# Patient Record
Sex: Male | Born: 1955 | Race: White | Hispanic: No | Marital: Single | Smoking: Former smoker
Health system: Southern US, Community
[De-identification: ages and names within clinical notes are randomized; demographics above are authoritative.]

## PROBLEM LIST (undated history)

## (undated) DIAGNOSIS — F1910 Other psychoactive substance abuse, uncomplicated: Secondary | ICD-10-CM

## (undated) DIAGNOSIS — I739 Peripheral vascular disease, unspecified: Secondary | ICD-10-CM

## (undated) DIAGNOSIS — I2510 Atherosclerotic heart disease of native coronary artery without angina pectoris: Secondary | ICD-10-CM

## (undated) DIAGNOSIS — N289 Disorder of kidney and ureter, unspecified: Secondary | ICD-10-CM

## (undated) DIAGNOSIS — N182 Chronic kidney disease, stage 2 (mild): Secondary | ICD-10-CM

## (undated) DIAGNOSIS — I639 Cerebral infarction, unspecified: Secondary | ICD-10-CM

## (undated) DIAGNOSIS — E785 Hyperlipidemia, unspecified: Secondary | ICD-10-CM

## (undated) DIAGNOSIS — I255 Ischemic cardiomyopathy: Secondary | ICD-10-CM

## (undated) DIAGNOSIS — I10 Essential (primary) hypertension: Secondary | ICD-10-CM

## (undated) HISTORY — PX: CORONARY ARTERY BYPASS GRAFT: SHX141

## (undated) HISTORY — PX: FASCIOTOMY: SHX132

## (undated) HISTORY — PX: TOE AMPUTATION: SHX809

---

## 2012-05-04 ENCOUNTER — Emergency Department: Payer: Self-pay | Admitting: Internal Medicine

## 2012-07-10 ENCOUNTER — Ambulatory Visit: Payer: Self-pay | Admitting: Specialist

## 2012-07-13 ENCOUNTER — Ambulatory Visit: Payer: Self-pay | Admitting: Specialist

## 2012-09-26 ENCOUNTER — Emergency Department: Payer: Self-pay | Admitting: Internal Medicine

## 2012-09-26 LAB — BASIC METABOLIC PANEL
Anion Gap: 8 (ref 7–16)
BUN: 16 mg/dL (ref 7–18)
Calcium, Total: 9 mg/dL (ref 8.5–10.1)
Chloride: 106 mmol/L (ref 98–107)
Co2: 24 mmol/L (ref 21–32)
EGFR (African American): >60
EGFR (Non-African Amer.): >60
Glucose: 124 mg/dL — ABNORMAL HIGH (ref 65–99)
Sodium: 138 mmol/L (ref 136–145)

## 2012-09-26 LAB — TROPONIN I
Troponin-I: <0.02 ng/mL
Troponin-I: <0.02 ng/mL

## 2012-09-26 LAB — CK TOTAL AND CKMB (NOT AT ARMC)
CK, Total: 76 U/L (ref 35–232)
CK-MB: 1 ng/mL (ref 0.5–3.6)

## 2012-09-26 LAB — CBC
HCT: 44.4 % (ref 40.0–52.0)
HGB: 15.4 g/dL (ref 13.0–18.0)
MCH: 35.5 pg — ABNORMAL HIGH (ref 26.0–34.0)
MCHC: 34.8 g/dL (ref 32.0–36.0)
MCV: 102 fL — ABNORMAL HIGH (ref 80–100)
RBC: 4.35 10*6/uL — ABNORMAL LOW (ref 4.40–5.90)
RDW: 13.9 % (ref 11.5–14.5)

## 2013-03-06 ENCOUNTER — Emergency Department: Payer: Self-pay | Admitting: Emergency Medicine

## 2013-03-06 LAB — URINALYSIS, COMPLETE
Bacteria: NONE SEEN
Bilirubin,UR: NEGATIVE
Blood: NEGATIVE
Leukocyte Esterase: NEGATIVE
Specific Gravity: 1.012 (ref 1.003–1.030)
Squamous Epithelial: NONE SEEN

## 2013-03-06 LAB — CBC WITH DIFFERENTIAL/PLATELET
Basophil %: 1 %
Eosinophil %: 3.8 %
HCT: 44.6 % (ref 40.0–52.0)
HGB: 15.6 g/dL (ref 13.0–18.0)
Lymphocyte #: 1.2 10*3/uL (ref 1.0–3.6)
Lymphocyte %: 14.8 %
MCHC: 34.9 g/dL (ref 32.0–36.0)
Monocyte #: 0.7 x10 3/mm (ref 0.2–1.0)
Monocyte %: 8.6 %
Neutrophil #: 5.9 10*3/uL (ref 1.4–6.5)
Neutrophil %: 71.8 %
Platelet: 165 10*3/uL (ref 150–440)
RBC: 4.43 10*6/uL (ref 4.40–5.90)
WBC: 8.2 10*3/uL (ref 3.8–10.6)

## 2013-03-06 LAB — CK TOTAL AND CKMB (NOT AT ARMC)
CK, Total: 123 U/L (ref 35–232)
CK-MB: 0.8 ng/mL (ref 0.5–3.6)

## 2013-03-06 LAB — TROPONIN I
Troponin-I: <0.02 ng/mL
Troponin-I: <0.02 ng/mL

## 2013-03-06 LAB — BASIC METABOLIC PANEL
BUN: 14 mg/dL (ref 7–18)
Calcium, Total: 9.1 mg/dL (ref 8.5–10.1)
Chloride: 104 mmol/L (ref 98–107)
Co2: 26 mmol/L (ref 21–32)
Creatinine: 1.13 mg/dL (ref 0.60–1.30)
Glucose: 127 mg/dL — ABNORMAL HIGH (ref 65–99)
Osmolality: 276 (ref 275–301)
Potassium: 4.4 mmol/L (ref 3.5–5.1)

## 2013-03-21 ENCOUNTER — Emergency Department: Payer: Self-pay

## 2013-03-21 LAB — CBC
MCH: 35.5 pg — ABNORMAL HIGH (ref 26.0–34.0)
MCHC: 35.3 g/dL (ref 32.0–36.0)
MCV: 101 fL — ABNORMAL HIGH (ref 80–100)
Platelet: 169 10*3/uL (ref 150–440)
RDW: 12.9 % (ref 11.5–14.5)
WBC: 9.5 10*3/uL (ref 3.8–10.6)

## 2013-03-21 LAB — BASIC METABOLIC PANEL
Anion Gap: 8 (ref 7–16)
BUN: 14 mg/dL (ref 7–18)
Calcium, Total: 8.6 mg/dL (ref 8.5–10.1)
Creatinine: 1.18 mg/dL (ref 0.60–1.30)
Glucose: 108 mg/dL — ABNORMAL HIGH (ref 65–99)
Osmolality: 279 (ref 275–301)

## 2013-03-21 LAB — APTT: Activated PTT: 33.6 secs (ref 23.6–35.9)

## 2013-05-09 ENCOUNTER — Inpatient Hospital Stay: Payer: Self-pay | Admitting: Specialist

## 2013-05-09 DIAGNOSIS — R7989 Other specified abnormal findings of blood chemistry: Secondary | ICD-10-CM

## 2013-05-09 DIAGNOSIS — I10 Essential (primary) hypertension: Secondary | ICD-10-CM

## 2013-05-09 DIAGNOSIS — I2510 Atherosclerotic heart disease of native coronary artery without angina pectoris: Secondary | ICD-10-CM

## 2013-05-09 DIAGNOSIS — I209 Angina pectoris, unspecified: Secondary | ICD-10-CM

## 2013-05-09 LAB — CK TOTAL AND CKMB (NOT AT ARMC)
CK, TOTAL: 238 U/L — AB (ref 35–232)
CK, Total: 179 U/L (ref 35–232)
CK, Total: 176 U/L (ref 35–232)
CK-MB: 1.5 ng/mL (ref 0.5–3.6)
CK-MB: 1.5 ng/mL (ref 0.5–3.6)
CK-MB: 1.7 ng/mL (ref 0.5–3.6)

## 2013-05-09 LAB — LIPASE, BLOOD: LIPASE: 343 U/L (ref 73–393)

## 2013-05-09 LAB — COMPREHENSIVE METABOLIC PANEL
ANION GAP: 12 (ref 7–16)
AST: 21 U/L (ref 15–37)
Albumin: 3.7 g/dL (ref 3.4–5.0)
Alkaline Phosphatase: 59 U/L
BUN: 20 mg/dL — AB (ref 7–18)
Bilirubin,Total: 0.3 mg/dL (ref 0.2–1.0)
CALCIUM: 9.1 mg/dL (ref 8.5–10.1)
CHLORIDE: 106 mmol/L (ref 98–107)
CO2: 24 mmol/L (ref 21–32)
CREATININE: 1.42 mg/dL — AB (ref 0.60–1.30)
EGFR (Non-African Amer.): 54 — ABNORMAL LOW
Glucose: 112 mg/dL — ABNORMAL HIGH (ref 65–99)
OSMOLALITY: 286 (ref 275–301)
Potassium: 3.7 mmol/L (ref 3.5–5.1)
SGPT (ALT): 34 U/L (ref 12–78)
SODIUM: 142 mmol/L (ref 136–145)
Total Protein: 7.3 g/dL (ref 6.4–8.2)

## 2013-05-09 LAB — TROPONIN I
TROPONIN-I: 0.12 ng/mL — AB
Troponin-I: 0.13 ng/mL — ABNORMAL HIGH
Troponin-I: 0.11 ng/mL — ABNORMAL HIGH

## 2013-05-09 LAB — CBC
HCT: 44.8 % (ref 40.0–52.0)
HGB: 15 g/dL (ref 13.0–18.0)
MCH: 34.7 pg — AB (ref 26.0–34.0)
MCHC: 33.6 g/dL (ref 32.0–36.0)
MCV: 103 fL — ABNORMAL HIGH (ref 80–100)
Platelet: 163 10*3/uL (ref 150–440)
RBC: 4.33 10*6/uL — ABNORMAL LOW (ref 4.40–5.90)
RDW: 14.6 % — ABNORMAL HIGH (ref 11.5–14.5)
WBC: 6.7 10*3/uL (ref 3.8–10.6)

## 2013-05-09 LAB — ETHANOL
ETHANOL %: 0.034 % (ref 0.000–0.080)
Ethanol: 34 mg/dL

## 2013-05-10 LAB — BASIC METABOLIC PANEL
Anion Gap: 7 (ref 7–16)
BUN: 13 mg/dL (ref 7–18)
CHLORIDE: 110 mmol/L — AB (ref 98–107)
CREATININE: 0.94 mg/dL (ref 0.60–1.30)
Calcium, Total: 8.2 mg/dL — ABNORMAL LOW (ref 8.5–10.1)
Co2: 20 mmol/L — ABNORMAL LOW (ref 21–32)
EGFR (Non-African Amer.): >60
Glucose: 121 mg/dL — ABNORMAL HIGH (ref 65–99)
OSMOLALITY: 275 (ref 275–301)
Potassium: 5.2 mmol/L — ABNORMAL HIGH (ref 3.5–5.1)
Sodium: 137 mmol/L (ref 136–145)

## 2013-05-10 LAB — CBC WITH DIFFERENTIAL/PLATELET
BASOS ABS: 0.1 10*3/uL (ref 0.0–0.1)
Basophil %: 0.9 %
Eosinophil #: 0.3 10*3/uL (ref 0.0–0.7)
Eosinophil %: 4.8 %
HCT: 42.5 % (ref 40.0–52.0)
HGB: 14.8 g/dL (ref 13.0–18.0)
LYMPHS ABS: 1.1 10*3/uL (ref 1.0–3.6)
LYMPHS PCT: 16 %
MCH: 36 pg — AB (ref 26.0–34.0)
MCHC: 34.9 g/dL (ref 32.0–36.0)
MCV: 103 fL — ABNORMAL HIGH (ref 80–100)
MONOS PCT: 10.9 %
Monocyte #: 0.8 x10 3/mm (ref 0.2–1.0)
NEUTROS ABS: 4.8 10*3/uL (ref 1.4–6.5)
Neutrophil %: 67.4 %
PLATELETS: 135 10*3/uL — AB (ref 150–440)
RBC: 4.12 10*6/uL — AB (ref 4.40–5.90)
RDW: 14.3 % (ref 11.5–14.5)
WBC: 7.1 10*3/uL (ref 3.8–10.6)

## 2013-05-10 LAB — LIPID PANEL
CHOLESTEROL: 256 mg/dL — AB (ref 0–200)
HDL: 19 mg/dL — AB (ref 40–60)
Triglycerides: 1125 mg/dL — ABNORMAL HIGH (ref 0–200)

## 2013-05-10 LAB — MAGNESIUM: Magnesium: 2.5 mg/dL — ABNORMAL HIGH

## 2013-07-03 ENCOUNTER — Inpatient Hospital Stay: Payer: Self-pay | Admitting: Internal Medicine

## 2013-07-03 DIAGNOSIS — I2510 Atherosclerotic heart disease of native coronary artery without angina pectoris: Secondary | ICD-10-CM

## 2013-07-03 DIAGNOSIS — I209 Angina pectoris, unspecified: Secondary | ICD-10-CM

## 2013-07-03 DIAGNOSIS — I10 Essential (primary) hypertension: Secondary | ICD-10-CM

## 2013-07-03 DIAGNOSIS — E785 Hyperlipidemia, unspecified: Secondary | ICD-10-CM

## 2013-07-03 LAB — CBC
HCT: 46.8 % (ref 40.0–52.0)
HGB: 16 g/dL (ref 13.0–18.0)
MCH: 34.9 pg — AB (ref 26.0–34.0)
MCHC: 34.2 g/dL (ref 32.0–36.0)
MCV: 102 fL — ABNORMAL HIGH (ref 80–100)
PLATELETS: 146 10*3/uL — AB (ref 150–440)
RBC: 4.58 10*6/uL (ref 4.40–5.90)
RDW: 13.7 % (ref 11.5–14.5)
WBC: 6.4 10*3/uL (ref 3.8–10.6)

## 2013-07-03 LAB — URINALYSIS, COMPLETE
BILIRUBIN, UR: NEGATIVE
Bacteria: NONE SEEN
Blood: NEGATIVE
Glucose,UR: NEGATIVE mg/dL (ref 0–75)
Ketone: NEGATIVE
LEUKOCYTE ESTERASE: NEGATIVE
Nitrite: NEGATIVE
PH: 6 (ref 4.5–8.0)
PROTEIN: NEGATIVE
RBC,UR: <1 /HPF (ref 0–5)
SPECIFIC GRAVITY: 1.004 (ref 1.003–1.030)
Squamous Epithelial: NONE SEEN
WBC UR: NONE SEEN /HPF (ref 0–5)

## 2013-07-03 LAB — COMPREHENSIVE METABOLIC PANEL
ALK PHOS: 62 U/L
Albumin: 3.9 g/dL (ref 3.4–5.0)
Anion Gap: 6 — ABNORMAL LOW (ref 7–16)
BUN: 13 mg/dL (ref 7–18)
Bilirubin,Total: 0.6 mg/dL (ref 0.2–1.0)
CO2: 24 mmol/L (ref 21–32)
CREATININE: 1.33 mg/dL — AB (ref 0.60–1.30)
Calcium, Total: 8.7 mg/dL (ref 8.5–10.1)
Chloride: 108 mmol/L — ABNORMAL HIGH (ref 98–107)
EGFR (African American): >60
EGFR (Non-African Amer.): 58 — ABNORMAL LOW
GLUCOSE: 164 mg/dL — AB (ref 65–99)
Osmolality: 279 (ref 275–301)
Potassium: 3.8 mmol/L (ref 3.5–5.1)
SGOT(AST): 24 U/L (ref 15–37)
SGPT (ALT): 21 U/L (ref 12–78)
Sodium: 138 mmol/L (ref 136–145)
Total Protein: 7.3 g/dL (ref 6.4–8.2)

## 2013-07-03 LAB — MAGNESIUM: Magnesium: 1.8 mg/dL

## 2013-07-03 LAB — TROPONIN I

## 2013-07-03 LAB — APTT: Activated PTT: 31.1 secs (ref 23.6–35.9)

## 2013-07-03 LAB — PRO B NATRIURETIC PEPTIDE: B-TYPE NATIURETIC PEPTID: 165 pg/mL — AB (ref 0–125)

## 2013-07-04 LAB — CK: CK, Total: 99 U/L

## 2013-07-04 LAB — TROPONIN I

## 2013-07-04 LAB — MAGNESIUM: Magnesium: 2 mg/dL

## 2014-02-13 ENCOUNTER — Other Ambulatory Visit: Payer: Self-pay | Admitting: Physician Assistant

## 2014-02-13 ENCOUNTER — Inpatient Hospital Stay: Payer: Self-pay | Admitting: Internal Medicine

## 2014-02-13 DIAGNOSIS — I200 Unstable angina: Secondary | ICD-10-CM

## 2014-02-13 LAB — TROPONIN I
Troponin-I: <0.02 ng/mL
Troponin-I: <0.02 ng/mL

## 2014-02-13 LAB — CBC
HCT: 48.7 % (ref 40.0–52.0)
HGB: 16.3 g/dL (ref 13.0–18.0)
MCH: 34.1 pg — AB (ref 26.0–34.0)
MCHC: 33.5 g/dL (ref 32.0–36.0)
MCV: 102 fL — AB (ref 80–100)
Platelet: 228 10*3/uL (ref 150–440)
RBC: 4.79 10*6/uL (ref 4.40–5.90)
RDW: 13.6 % (ref 11.5–14.5)
WBC: 10.8 10*3/uL — ABNORMAL HIGH (ref 3.8–10.6)

## 2014-02-13 LAB — BASIC METABOLIC PANEL
Anion Gap: 8 (ref 7–16)
BUN: 22 mg/dL — ABNORMAL HIGH (ref 7–18)
CALCIUM: 8.9 mg/dL (ref 8.5–10.1)
CREATININE: 1.42 mg/dL — AB (ref 0.60–1.30)
Chloride: 101 mmol/L (ref 98–107)
Co2: 29 mmol/L (ref 21–32)
GFR CALC NON AF AMER: 54 — AB
GLUCOSE: 108 mg/dL — AB (ref 65–99)
OSMOLALITY: 280 (ref 275–301)
Potassium: 4 mmol/L (ref 3.5–5.1)
SODIUM: 138 mmol/L (ref 136–145)

## 2014-02-13 LAB — APTT: ACTIVATED PTT: 28.4 s (ref 23.6–35.9)

## 2014-02-13 LAB — PROTIME-INR
INR: 0.9
Prothrombin Time: 12.4 secs (ref 11.5–14.7)

## 2014-02-13 LAB — HEPARIN LEVEL (UNFRACTIONATED): Anti-Xa(Unfractionated): 0.41 IU/mL (ref 0.30–0.70)

## 2014-02-14 DIAGNOSIS — I351 Nonrheumatic aortic (valve) insufficiency: Secondary | ICD-10-CM

## 2014-02-14 DIAGNOSIS — E785 Hyperlipidemia, unspecified: Secondary | ICD-10-CM

## 2014-02-14 DIAGNOSIS — I10 Essential (primary) hypertension: Secondary | ICD-10-CM

## 2014-02-14 DIAGNOSIS — I25110 Atherosclerotic heart disease of native coronary artery with unstable angina pectoris: Secondary | ICD-10-CM

## 2014-02-14 LAB — BASIC METABOLIC PANEL
Anion Gap: 6 — ABNORMAL LOW (ref 7–16)
BUN: 22 mg/dL — ABNORMAL HIGH (ref 7–18)
Calcium, Total: 7.9 mg/dL — ABNORMAL LOW (ref 8.5–10.1)
Chloride: 103 mmol/L (ref 98–107)
Co2: 31 mmol/L (ref 21–32)
Creatinine: 1.5 mg/dL — ABNORMAL HIGH (ref 0.60–1.30)
EGFR (African American): >60
EGFR (Non-African Amer.): 51 — ABNORMAL LOW
GLUCOSE: 105 mg/dL — AB (ref 65–99)
Osmolality: 283 (ref 275–301)
Potassium: 3.5 mmol/L (ref 3.5–5.1)
SODIUM: 140 mmol/L (ref 136–145)

## 2014-02-14 LAB — CBC WITH DIFFERENTIAL/PLATELET
Basophil #: 0.1 10*3/uL (ref 0.0–0.1)
Basophil %: 0.5 %
Eosinophil #: 0.4 10*3/uL (ref 0.0–0.7)
Eosinophil %: 4 %
HCT: 42.1 % (ref 40.0–52.0)
HGB: 14.3 g/dL (ref 13.0–18.0)
LYMPHS PCT: 23.3 %
Lymphocyte #: 2.3 10*3/uL (ref 1.0–3.6)
MCH: 35 pg — ABNORMAL HIGH (ref 26.0–34.0)
MCHC: 34 g/dL (ref 32.0–36.0)
MCV: 103 fL — AB (ref 80–100)
Monocyte #: 0.8 x10 3/mm (ref 0.2–1.0)
Monocyte %: 7.6 %
NEUTROS PCT: 64.6 %
Neutrophil #: 6.4 10*3/uL (ref 1.4–6.5)
Platelet: 176 10*3/uL (ref 150–440)
RBC: 4.1 10*6/uL — AB (ref 4.40–5.90)
RDW: 13.9 % (ref 11.5–14.5)
WBC: 9.9 10*3/uL (ref 3.8–10.6)

## 2014-02-14 LAB — LIPID PANEL
Cholesterol: 135 mg/dL (ref 0–200)
HDL: 30 mg/dL — AB (ref 40–60)
Triglycerides: 441 mg/dL — ABNORMAL HIGH (ref 0–200)

## 2014-02-14 LAB — HEPARIN LEVEL (UNFRACTIONATED): ANTI-XA(UNFRACTIONATED): 0.29 [IU]/mL — AB (ref 0.30–0.70)

## 2014-02-22 ENCOUNTER — Encounter: Payer: Self-pay | Admitting: Cardiovascular Disease

## 2014-04-22 ENCOUNTER — Emergency Department: Payer: Self-pay | Admitting: Emergency Medicine

## 2014-04-22 LAB — COMPREHENSIVE METABOLIC PANEL
ALT: 37 U/L
Albumin: 3.7 g/dL (ref 3.4–5.0)
Alkaline Phosphatase: 64 U/L
Anion Gap: 10 (ref 7–16)
BUN: 17 mg/dL (ref 7–18)
Bilirubin,Total: 0.4 mg/dL (ref 0.2–1.0)
CHLORIDE: 107 mmol/L (ref 98–107)
CREATININE: 1.46 mg/dL — AB (ref 0.60–1.30)
Calcium, Total: 8.3 mg/dL — ABNORMAL LOW (ref 8.5–10.1)
Co2: 24 mmol/L (ref 21–32)
EGFR (Non-African Amer.): 52 — ABNORMAL LOW
Glucose: 154 mg/dL — ABNORMAL HIGH (ref 65–99)
Osmolality: 286 (ref 275–301)
POTASSIUM: 3.8 mmol/L (ref 3.5–5.1)
SGOT(AST): 34 U/L (ref 15–37)
Sodium: 141 mmol/L (ref 136–145)
Total Protein: 7 g/dL (ref 6.4–8.2)

## 2014-04-22 LAB — LIPASE, BLOOD: LIPASE: 234 U/L (ref 73–393)

## 2014-04-22 LAB — CBC
HCT: 43.8 % (ref 40.0–52.0)
HGB: 15 g/dL (ref 13.0–18.0)
MCH: 34.9 pg — ABNORMAL HIGH (ref 26.0–34.0)
MCHC: 34.3 g/dL (ref 32.0–36.0)
MCV: 102 fL — AB (ref 80–100)
PLATELETS: 186 10*3/uL (ref 150–440)
RBC: 4.3 10*6/uL — AB (ref 4.40–5.90)
RDW: 13.8 % (ref 11.5–14.5)
WBC: 7.5 10*3/uL (ref 3.8–10.6)

## 2014-05-17 ENCOUNTER — Ambulatory Visit: Payer: Self-pay | Admitting: Anesthesiology

## 2014-05-17 ENCOUNTER — Ambulatory Visit: Payer: Self-pay | Admitting: Gastroenterology

## 2014-06-18 ENCOUNTER — Ambulatory Visit: Payer: Self-pay | Admitting: Unknown Physician Specialty

## 2014-08-09 NOTE — Op Note (Signed)
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    OPERATION: Excision of recurrent ganglion of the right small finger flexor tendon sheath.   SURGEON: Valinda HoarHoward E. Sihaam Chrobak, MD  ANESTHESIA: Digital block with 2% Xylocaine.   COMPLICATIONS: None.   DRAINS: None.   ESTIMATED BLOOD LOSS: None.   OPERATIVE PROCEDURE: The patient was brought to the operating room where he underwent said digital block anesthesia with 2% Xylocaine. The arm was prepped and draped in sterile fashion. Esmarch was applied and the tourniquet inflated to 300 mmHg. Tourniquet time was 17 minutes. A V-shaped incision was made over the volar aspect of the finger over the proximal middle phalangeal areas. The patient had a prior traumatic amputation through the DIP joint. Dissection was carried out carefully with scissors under loupe magnification. The thickened sac of the ganglion was identified and thick viscous fluid was expressed. There was no sign of infection. The dissection was carried out carefully around the ganglion sac completely, and this was completely excised. The digital nerve was freed up from adhesions on the ulnar side. The flexor tendon was intact. A small rongeur was used to remove smaller pieces of tissue. The wound was then irrigated and closed with 5-0 nylon suture. Dry sterile dressing was applied and the tourniquet was deflated, with good return of blood flow to the finger. The patient was then transferred back to the same-day surgery area, having tolerated this well.    ____________________________ Valinda HoarHoward E. Srihitha Tagliaferri, MD hem:dm Whitaker: 07/13/2012 08:19:47 ET T: 07/13/2012 09:53:31 ET JOB#: [id number]  cc: Valinda HoarHoward E. Avyanna Spada, MD, <Dictator> Valinda HoarHOWARD E Peggy Monk  MD ELECTRONICALLY SIGNED 07/14/2012 15:41

## 2014-08-10 NOTE — Consult Note (Signed)
General Aspect Dylan Whitaker is a 59yo Caucasian male w/ an extensive cardiac history, HTN, HLD, OSA on CPAP, ongoing tobacco abuse and GERD who is being observed at [facility] overnight for recurrent angina. Cardiology consulted for the same.   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 He has had multiple PCIs since. He is primarily followed at [facility]. He had a heart catheterization ~ 09/2012 at [facility] for class III-IV angina. No PCI was performed. Antianginals started for small vessel dz. He had a nuclear stress test and echo at [facility] 03/2013. Both studies were normal, no ischemia, WMAs. Preserved EF.   He was seen in consultation 04/2013 for demand ischemia in the setting of diarrhea and dehydration. A mild troponin elevation w/ plateau was observed. He did endorse chest pain c/w prior angina. Imdur and Norvasc were up-titrated to 44m and 7.528mdaily, respectively. He was discharged and followed up with his primary cardiologist @ [facility] ~ 1 month ago. Antianginals were unchanged, fibrate was added given evidence of marked hypertriglyceridemia last admission. Ranexa has been attempted previously, but was stopped due to cost. He was paying for his daughter's college and states he may be able to afford more easily currently.   He was in his USOH until 3-4 days ago when he began experiencing intermittent bilateral arm/shoulder aching w/ associated substernal chest pressure c/w prior angina. These episodes are typically 1/10 at baseline, but increased to 6/10 during this period, and mostly at rest. He continues to take his antianginals as prescribed. He has been using NTG patches q18hr and NTG SL PRN w/ intermittent relief. He has not had much success with morphine or oxycodone PO. He reports associated fatigue during this time. He denies SOB/DOB, diaphoresis, nausea, palpitations, PND, orthopnea, LE edema, weight gain or syncope. No emotional life stressors. No cough, fevers or chills. He continues to smoke 1/2 PPD.  Feels he manages stress well.   Present Illness EKG in the showed NSR, 1st degree AVB and previously identified inferior Q waves. Initial TnI WNL. BUN 13/Cr 1.33. BNP 165. LFTs WNL. CBC- PLT 146. MCV 102. CXR- coronary artery calcifications noted, otherwise no acute process.  PAST MEDICAL HISTORY: 1.  CAD 2.  Hypertension.  3.  GERD.  4.  Hyperlipidemia.    PAST SURGICAL HISTORY:  1.  Carotid stent. 2.  Cardiac bypass.    SOCIAL HISTORY: The patient still smokes 1/2 pack per day. Drink 2 beers or mixed drinks nightly. No illicit drug use.   FAMILY HISTORY: Significant for coronary artery disease at young age in the family.   ALLERGIES: NO KNOWN DRUG ALLERGIES.  CURRENT HOME MEDICATIONS:  1.  Aspirin 81 mg p.o. daily.  2.  Plavix 75 mg p.o. daily.  3.  Amlodipine 7.5 mg p.o. daily.  4.  Atorvastatin 40 mg p.o. daily.  5.  Fenofibrate 48 mg p.o. daily.  6.  HCTZ 25 mg p.o. daily.  7.  Imdur 90 mg p.o. daily.  8.  Metoprolol 100 mg p.o. b.i.d. 9.  Nitro-Dur 0.2 mg per hour, extended release patch, once a day.  10.  Protonix 40 mg p.o. daily.  11.  Promethazine 25 mg p.o. q. 6 hours p.r.n.  12.  Ramipril 5 mg p.o. daily   Physical Exam:  GEN well developed, no acute distress, thin   HEENT pink conjunctivae, PERRL, hearing intact to voice   NECK supple  No masses  trachea midline  no JVD or bruits   RESP normal  resp effort  clear BS  no use of accessory muscles   CARD Regular rate and rhythm  Normal, S1, S2  No murmur   ABD denies tenderness  soft  normal BS   EXTR negative cyanosis/clubbing, negative edema   SKIN normal to palpation, scars noted, linear CABG scar appreciated   NEURO follows commands, motor/sensory function intact   PSYCH alert, A+O to time, place, person   Review of Systems:  Subjective/Chief Complaint chest pain, arm pain   General: Fatigue   Skin: No Complaints    ENT: No Complaints    Eyes: No Complaints    Respiratory: No Complaints     Cardiovascular: Chest pain or discomfort   Gastrointestinal: No Complaints    Genitourinary: No Complaints    Vascular: No Complaints    Musculoskeletal: arm pain    Neurologic: No Complaints    Hematologic: No Complaints    Endocrine: No Complaints    Psychiatric: No Complaints    Review of Systems: All other systems were reviewed and found to be negative   Medications/Allergies Reviewed Medications/Allergies reviewed    Lab Results:  Hepatic:  17-Mar-15 09:43   Bilirubin, Total 0.6  Alkaline Phosphatase 62 (45-117 NOTE: New Reference Range 03/09/13)  SGPT (ALT) 21  SGOT (AST) 24  Total Protein, Serum 7.3  Albumin, Serum 3.9  Routine Chem:  17-Mar-15 09:43   B-Type Natriuretic Peptide ([facility])  165 (Result(s) reported on 03 Jul 2013 at 10:21AM.)  Glucose, Serum  164  BUN 13  Creatinine (comp)  1.33  Sodium, Serum 138  Potassium, Serum 3.8  Chloride, Serum  108  CO2, Serum 24  Calcium (Total), Serum 8.7  Osmolality (calc) 279  eGFR (African American) >60  eGFR (Non-African American)  58 (eGFR values <12m/min/1.73 m2 may be an indication of chronic kidney disease (CKD). Calculated eGFR is useful in patients with stable renal function. The eGFR calculation will not be reliable in acutely ill patients when serum creatinine is changing rapidly. It is not useful in  patients on dialysis. The eGFR calculation may not be applicable to patients at the low and high extremes of body sizes, pregnant women, and vegetarians.)  Anion Gap  6  Cardiac:  17-Mar-15 09:43   Troponin I < 0.02 (0.00-0.05 0.05 ng/mL or less: NEGATIVE  Repeat testing in 3-6 hrs  if clinically indicated. >0.05 ng/mL: POTENTIAL  MYOCARDIAL INJURY. Repeat  testing in 3-6 hrs if  clinically indicated. NOTE: An increase or decrease  of 30% or more on serial  testing suggests a  clinically important change)  Routine UA:  17-Mar-15 11:23   Color (UA) Straw  Clarity (UA) Clear  Glucose  (UA) Negative  Bilirubin (UA) Negative  Ketones (UA) Negative  Specific Gravity (UA) 1.004  Blood (UA) Negative  pH (UA) 6.0  Protein (UA) Negative  Nitrite (UA) Negative  Leukocyte Esterase (UA) Negative (Result(s) reported on 03 Jul 2013 at 11:41AM.)  RBC (UA) <1 /HPF  WBC (UA) NONE SEEN  Bacteria (UA) NONE SEEN  Epithelial Cells (UA) NONE SEEN  Result(s) reported on 03 Jul 2013 at 11:41AM.  Routine Coag:  17-Mar-15 09:43   Activated PTT (APTT) 31.1 (A HCT value >55% may artifactually increase the APTT. In one study, the increase was an average of 19%. Reference: "Effect on Routine and Special Coagulation Testing Values of Citrate Anticoagulant Adjustment in Patients with High HCT Values." American Journal of Clinical Pathology 2006;126:400-405.)  Routine Hem:  17-Mar-15 09:43   WBC (CBC) 6.4  RBC (CBC) 4.58  Hemoglobin (CBC) 16.0  Hematocrit (CBC) 46.8  Platelet Count (CBC)  146 (Result(s) reported on 03 Jul 2013 at 10:07AM.)  MCV  102  MCH  34.9  MCHC 34.2  RDW 13.7   EKG:  Interpretation EKG shows NSR, 1st degree AVB, LAE, Q waves II, III, aVF, no ST/T changes   Rate 75   EKG Comparision Not changed from  04/2013 tracing   Additional Comments QTc 473 ms   Radiology Results: XRay:    17-Mar-15 10:23, Chest Portable Single View  Chest Portable Single View   REASON FOR EXAM:    Chest pain  COMMENTS:       PROCEDURE: DXR - DXR PORTABLE CHEST SINGLE VIEW  - Jul 03 2013 10:23AM     CLINICAL DATA:  Chest and left arm painx2days, smoker,  nocopd/noasthma    EXAM:  PORTABLE CHEST - 1 VIEW    COMPARISON:  DG CHEST 1V PORT dated 03/21/2013    FINDINGS:  Low lung volumes. Patient is status post median sternotomy. Coronary  artery atherosclerotic calcifications are appreciated. The heart  size and mediastinal contours are within normal limits. Both lungs  are clear. The visualized skeletal structures are unremarkable.     IMPRESSION:  No active  disease.      Electronically Signed    By: Margaree Mackintosh M.D.    On: 07/03/2013 10:37         Verified By: Mikki Santee, M.D., MD    No Known Allergies:   Vital Signs/Nurse's Notes: **Vital Signs.:   17-Mar-15 15:01  Vital Signs Type Admission  Temperature Temperature (F) 97.4  Celsius 36.3  Temperature Source oral  Pulse Pulse 59  Respirations Respirations 18  Systolic BP Systolic BP 947  Diastolic BP (mmHg) Diastolic BP (mmHg) 75  Mean BP 90  Pulse Ox % Pulse Ox % 100  Pulse Ox Activity Level  At rest  Oxygen Delivery 2L    Impression 59yo Caucasian male w/ an extensive cardiac history, HTN, HLD, OSA on CPAP, ongoing tobacco abuse and GERD. Cardiology consulted for elevated troponins.   1. CCS class III-IV angina patient reports severe (6/10) chest pressure c/w prior episodes of angina  New symptoms of bilateral shoulder aching  He continues to take recently up-titrated antianginals as prescribed.  Chronic small vessel disease and chronic angina at baseline; recently started on fenofibrate for hypertriglyceridemia which may have an additive risk of myalgias/myopathy when paired with a high-potency statin like atorvastatin.  --continues to smoke 1/2 PPD which is progressing small vessel dz and causes vasoconstriction.  Previously unable to afford Ranexa   --He has follow up with [facility]ology this friday He does not want additional studies and given recent stress test and cath, none needed if repeat troponin is negative -- Medication options include: Stop fenofibrate, start ranexa 1000 mg BID (coupon given), he could stop lipitor for myalgias and consider trying crestor (he can talk with [facility] about change) -- Smoking cessation stressed. Offered nicotine replacement therapy. He uses electronic, does not want chantix  -- Could increase Imdur frequency to BID   Plan . 2. CAD Chronic angina as above. Although angina has increased in intensity, this may  overlap with myalgias from statin + fibrate combo. Not convinced of ACS. troponin pendiong.  Cath 09/2012 w/o significant intervenable dz, Myoview 03/2013 w/o ischemia.  -- Continue low-dose ASA, Plavix, ACEi, BB, statin, antianginals -- Couldf consider  P2Y12 assay as an outpatient  3. Ongoing tobacco abuse -- Cessation stressed as above -- Offer NRT  4. Aortic atherosclerosis Per abdominal CT 04/2013. -- Continue to monitor  5. Hypertension Normotensive. -- Continue current antihypertensives  6. Hypertriglyceridemia/hyperlipidemia Lifestyle modification. -- Could hold fibrate -- Continue atorvastatin for now, diascuss with cardiology at [facility] whether he would like to try crestor   Electronic Signatures: Dylan Whitaker, Lyda Perone (PA-C)  (Signed 17-Mar-15 16:21)  Authored: General Aspect/Present Illness, History and Physical Exam, Review of System, Home Medications, Labs, EKG , Radiology, Allergies, Vital Signs/Nurse's Notes, Impression/Plan Ida Rogue (MD)  (Signed 18-Mar-15 08:14)  Authored: History and Physical Exam, Review of System, EKG , Vital Signs/Nurse's Notes, Impression/Plan  Co-Signer: General Aspect/Present Illness, History and Physical Exam, Review of System, Home Medications, Labs, EKG , Radiology, Allergies, Vital Signs/Nurse's Notes, Impression/Plan   Last Updated: 18-Mar-15 08:14 by Ida Rogue (MD)

## 2014-08-10 NOTE — H&P (Signed)
PATIENT NAME:  Dylan Whitaker, Dylan Whitaker MR#:  [id number] DATE OF BIRTH:  1955-12-28  DATE OF ADMISSION:  07/03/2013  ADMITTING PHYSICIAN: Gladstone Lighter, MD  PRIMARY CARE PHYSICIAN: Currently none. He is following with [facility].   CHIEF COMPLAINT: Chest pain.   HISTORY OF PRESENT ILLNESS: Dylan Whitaker is a 59 year old Caucasian male with past medical history significant for complicated cardiac disease and has a history of multiple stents in the past and also coronary artery bypass graft surgery about 3 to 4 years ago and also has chronic stable angina, presents to the hospital secondary to chest pain. The patient says his pain has been going on for 2 days now. At baseline he has chronic stable angina with milder pain, but usually is still functional, gets along with his job. He usually wears 24 hour nitro patch every day. Over the last couple of days, he felt that his chest pain has been more intense than baseline, but has been intermittent. This morning he woke up and had about 6/10 pressure in the chest radiating down both arms, did not feel good at all, just felt like his previous heart attacks and presented to the ER. Of note, the patient had previous admission here about 2 months ago, in January, at which time he was evaluated by cardiologist and no further tests were done. The patient's last stress test was about 3 months ago at [facility], which did not show any acute changes, according to patient. His last cardiac catheterization he believes was about 6 to 8 months ago with small vessel disease and medical management was recommended. Because of his continuous chest pain here, the patient was started on nitro drip and also heparin drip. His first set of troponin is negative. The patient denies any fevers, chills, dyspnea, diaphoresis or nausea associated with his chest pain.   PAST MEDICAL HISTORY: 1.  Coronary artery disease, status post stents and coronary artery bypass graft surgery.  2.  Chronic stable  angina.  3.  Gastroesophageal reflux disease.  4.  Hypertension.  5.  Hyperlipidemia.   PAST SURGICAL HISTORY: 1.  Multiple cardiac stents.  2.  Cardiac bypass surgery.  3.  Right leg arterial graft after his bypass due to blockage.   ALLERGIES TO MEDICATIONS: No known drug allergies.   CURRENT HOME MEDICATIONS:  1.  Aspirin 81 mg p.o. daily.  2.  Plavix 75 mg p.o. daily.  3.  Amlodipine 7.5 mg p.o. daily.  4.  Atorvastatin 40 mg p.o. daily.  5.  Fenofibrate 48 mg p.o. daily.  6.  HCTZ 25 mg p.o. daily.  7.  Imdur 90 mg p.o. daily.  8.  Metoprolol 100 mg p.o. b.i.d. 9.  Nitro-Dur 0.2 mg per hour, extended release patch, once a day.  10.  Protonix 40 mg p.o. daily.  11.  Promethazine 25 mg p.o. q. 6 hours p.r.n.  12.  Ramipril 5 mg p.o. daily.   SOCIAL HISTORY: He works out of [employer] and stays here during the week by himself, but his family includes his wife and also a daughter who he meets over the weekends. 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 Continues to smoke about 1/2 pack per day. Drinks about 2 beers every night. Denies any drug use.   FAMILY HISTORY: Significant for coronary artery disease in everybody on his father's side. They had significant heart disease by early 31s and passed away then.  REVIEW OF SYSTEMS:  CONSTITUTIONAL: No fever, fatigue or  weakness.  EYES: No blurred vision, double vision, inflammation, glaucoma or cataracts.  ENT: No tinnitus, ear pain, hearing loss, epistaxis or discharge.  RESPIRATORY: No cough, wheeze, hemoptysis or COPD. CARDIOVASCULAR: Positive for chest pain. No orthopnea, edema, arrhythmia, palpitations, or syncope.  GASTROINTESTINAL: No nausea, vomiting, diarrhea, abdominal pain, hematemesis or melena.  GENITOURINARY: No dysuria, hematuria, renal calculus, frequency or incontinence.  ENDOCRINE: No polyuria, nocturia, thyroid problems, heat or cold intolerance.  HEMATOLOGY: No anemia, bleeding or bleeding.   SKIN: No acne, rash or lesions.  MUSCULOSKELETAL: No neck, back or shoulder pain. No arthritis or gout. NEUROLOGIC: No numbness, weakness, CVA, TIA or seizures.  PSYCHOLOGICAL: No anxiety, insomnia, depression.   PHYSICAL EXAMINATION: VITAL SIGNS: Temperature 97.9 degrees Fahrenheit, pulse 70, respirations 20, blood pressure 156/84, pulse ox 99% on room air.  GENERAL: Well-built, well-nourished male sitting in bed, not in any acute distress.  HEENT: Normocephalic, atraumatic. Pupils equal, round, and reacting to light. Anicteric sclerae. Extraocular movements intact. Oropharynx clear without erythema, mass or exudates.  NECK: Supple. No thyromegaly, JVD or carotid bruits. LUNGS: Clear to auscultation bilaterally. No wheeze or crackles. No use of accessory muscles for breathing.  HEART: S1 and S2 regular rate and rhythm. No murmurs, rubs or gallops.  ABDOMEN: Soft, nontender, nondistended. No hepatosplenomegaly. Normal bowel sounds.  EXTREMITIES: No pedal edema. No clubbing or cyanosis. 2+ dorsalis pedis pulses palpable bilaterally.  SKIN: No acne, rash or lesions.  LYMPHATICS: No cervical or inguinal lymphadenopathy.  NEUROLOGIC: Cranial nerves intact. No focal motor or sensory deficits.  PSYCHOLOGIC: The patient is awake, alert, and oriented x3.  DIAGNOSTIC DATA: Laboratory: WBC 6.4, hemoglobin 16.3, hematocrit 46.8, platelet count 146,000.   Sodium 138, potassium 3.8, chloride 108, bicarb 24, BUN 13, creatinine 1.3, glucose 164, AND calcium 8.7.   ALT 21, AST 24, alk phos 62, total bili 0.6, albumin 3.9. Urinalysis: Negative for any infection. BNP is slightly elevated at 165.  Chest x-ray showing clear lung fields and no acute cardiopulmonary disease. First troponin is negative.   EKG is showing normal sinus rhythm. No acute ST-T wave abnormalities, heart rate of 75.   ASSESSMENT AND PLAN: This is a 59 year old male with history of coronary artery disease status post stents and  bypass and chronic stable angina who presents with chest pain.  1.  Unstable angina with significant cardiac history and last cardiac catheterization in the past year which showed only small vessel disease and medical management was recommended, last Myoview was about 3 months ago at [facility], presents with severe chest pain. Hold off on repeating another Myoview as his troponin first set is negative. Will admit to telemetry, will cycle troponins and consult cardiology. Will have to wait and see if cardiology recommends a cardiac catheterization again or not. Continue medical treatment including aspirin, Plavix, beta blocker, statin, and Imdur. Discontinue the nitro drip that was started in the ER and also the heparin drip for now. Will place 2 inch nitro paste.  2.  Coronary artery disease status post bypass graft surgery with history of chronic stable angina. Continue all the medications as mentioned above.  3.  Hypertension. Continue home medications.  4.  Gastroesophageal reflux disease. The patient is on Protonix.   CODE STATUS: FULL.  TIME SPENT ON ADMISSION: 50 minutes.  ____________________________ Gladstone Lighter, MD rk:sb D: 07/03/2013 12:57:33 ET T: 07/03/2013 13:55:50 ET JOB#: [id number]  cc: Gladstone Lighter, MD, <Dictator> Gladstone Lighter MD ELECTRONICALLY SIGNED 07/12/2013 16:00

## 2014-08-10 NOTE — Consult Note (Signed)
General Aspect Primary Cardiologist: [facility] ________________  59 year old male with an extensive cardiac history having had his first MI at age 89, CAD s/p multiple PCIs (primarily followed at [facility] - records not on [place]), last cardiac cath 09/2012 at [facility] for class III-IV angina. No PCI was performed. Also a history of HTN, HLD, OSA on CPAP, ongoing tobacco abuse, and GERD who last saw his primary cardiologist both 2 months ago and a couple of weeks ago 2/2 increasing exertional chest pain. No further evaluation was performed. He presents to [facility] today with the above as well as a 2 day history of worsening exertional right sided chest pain.  ________________  PMH: 1. CAD s/p multiple PCIs 2. HTN 3. HLD 4. OSA on CPAP 5. Ongoing tobacco abuse 6. GERD ________________   Present Illness 59 year old male with the above problem list who presented to [facility] ED today with increased right sided chest pain over the past 2 days after visting with his primary cardiologist twice within the past 2 months with complaints of increasing exertional chest pain.   Patient with extensive cardiac history having had his first MI at age 89. Everyone on his father's side of the family has significant CAD as well. He is s/p multiple PCIs with his last cardiac cath in 09/2012 at [facility] for class III-IV angina. No PCI was performed at that time. Small vessel disease was found. He was treated medically with Imdur and Ranexa. He has previously (winter of 2015) had issues affording Ranexa, however as of spring 2015 this has not been the case. He currently takes Ranexa 1000 mg bid and Imdur 90 mg.   He last had a nuclear stress test 03/2013 that showed no ischemia & normal EF. He also had an echo at this time that showed a normal EF.   He recently showed his primary cardiology team at [facility] 2 months ago and complained of increasing exertional chest pain. The PA was concerned and advised him to follow up in 4 weeks with  the cardiologist. Per the patient no further intervention was done.   He now presents with the above increasing exertional chest pain for the past 2 months, as well as over the past 2 days he has noticed an increased right sided exertional chest pain prompting him to come in for evaluation. No associated diaphoresis, SOB, nausea, vomiting, presyncope, or syncope. He has not missed any medication doses, including Plavix.   In the ED his EKG was found to be NSR, 65, left atrial enlargement, left axis, no st/t changes, TnI negative x 1. SCr 1.42, this is above his baseline of normal. He was given aspirin 324 mg, started on a heparin gtt, and a nitro gtt. He is currently chest pain free. Cardiology is consulted for futher management.   Physical Exam:  GEN well developed, well nourished, no acute distress   HEENT PERRL, hearing intact to voice, moist oral mucosa   NECK supple   RESP normal resp effort  clear BS   CARD Regular rate and rhythm  Normal, S1, S2  No murmur   ABD denies tenderness  soft  normal BS   EXTR negative edema   SKIN normal to palpation   NEURO cranial nerves intact   PSYCH alert, A+O to time, place, person, good insight   Review of Systems:  General: No Complaints   Skin: No Complaints   ENT: No Complaints   Eyes: No Complaints   Neck: No  Complaints   Respiratory: No Complaints   Cardiovascular: Chest pain or discomfort  Tightness   Gastrointestinal: No Complaints   Genitourinary: No Complaints   Vascular: No Complaints   Musculoskeletal: No Complaints   Neurologic: No Complaints   Hematologic: No Complaints   Endocrine: No Complaints   Psychiatric: No Complaints   Review of Systems: All other systems were reviewed and found to be negative   Medications/Allergies Reviewed Medications/Allergies reviewed   Family & Social History:  Family and Social History:  Family History Coronary Artery Disease  strong family history of CAD along his  father's side   Social History negative ETOH, negative Illicit drugs   + Tobacco Current (within 1 year)  continues to smoke 5-10 cigarettes daily   Place of Living Home     Hypertension:    MI - Myocardial Infarct:    Carotid Stent Placement:    CABG (Coronary Artery Bypass Graft):   Home Medications: Medication Instructions Status  Lipitor 80 mg oral tablet 1 tab(s) orally once a day (at bedtime) Active  Plavix 75 mg oral tablet 1 tab(s) orally once a day Active  promethazine 25 mg oral tablet 1 tab(s) orally every 6 hours, As Needed Active  fenofibrate 48 mg oral tablet 1 tab(s) orally once a day Active  pantoprazole 40 mg oral delayed release tablet 1 tab(s) orally 2 times a day Active  Lyrica 50 mg oral capsule 2 cap(s) orally once a day Active  Ranexa 500 mg oral tablet, extended release 2 tab(s) orally 2 times a day Active  isosorbide mononitrate extended release 60 mg oral tablet, extended release 1 tab(s) orally once a day (in the morning) Active  Nitro-Dur 0.2 mg/hr transdermal film, extended release 1 patch transdermal once a day Active  oxyCODONE 10 mg oral tablet 1 tab(s) orally once a day, As Needed Active  amLODIPine 10 mg oral tablet 0.5 tab(s) orally once a day Active  NIFEdipine extended release 30 mg oral tablet, extended release 1 tab(s) orally once a day Active  furosemide 40 mg oral tablet 1 tab(s) orally once a day Active  metoprolol 100 mg oral tablet, extended release 1 tab(s) orally 2 times a day (am and afternoon) Active   Lab Results:  Routine Chem:  28-Oct-15 09:29   Glucose, Serum  108  BUN  22  Creatinine (comp)  1.42  Sodium, Serum 138  Potassium, Serum 4.0  Chloride, Serum 101  CO2, Serum 29  Calcium (Total), Serum 8.9  Anion Gap 8  Osmolality (calc) 280  eGFR (African American) >60  eGFR (Non-African American)  54 (eGFR values <85m/min/1.73 m2 may be an indication of chronic kidney disease (CKD). Calculated eGFR, using the MRDR  Study equation, is useful in  patients with stable renal function. The eGFR calculation will not be reliable in acutely ill patients when serum creatinine is changing rapidly. It is not useful in patients on dialysis. The eGFR calculation may not be applicable to patients at the low and high extremes of body sizes, pregnant women, and vetetarians.)  Result Comment potassium/bun - Slight hemolysis, interpret results with  - caution.  Result(s) reported on 13 Feb 2014 at 10:08AM.  Cardiac:  28-Oct-15 09:29   Troponin I < 0.02 (0.00-0.05 0.05 ng/mL or less: NEGATIVE  Repeat testing in 3-6 hrs  if clinically indicated. >0.05 ng/mL: POTENTIAL  MYOCARDIAL INJURY. Repeat  testing in 3-6 hrs if  clinically indicated. NOTE: An increase or decrease  of 30% or more on serial  testing suggests a  clinically important change)  Routine Coag:  28-Oct-15 09:29   Prothrombin 12.4  INR 0.9 (INR reference interval applies to patients on anticoagulant therapy. A single INR therapeutic range for coumarins is not optimal for all indications; however, the suggested range for most indications is 2.0 - 3.0. Exceptions to the INR Reference Range may include: Prosthetic heart valves, acute myocardial infarction, prevention of myocardial infarction, and combinations of aspirin and anticoagulant. The need for a higher or lower target INR must be assessed individually. Reference: The Pharmacology and Management of the Vitamin K  antagonists: the seventh ACCP Conference on Antithrombotic and Thrombolytic Therapy. KZSWF.0932 Sept:126 (3suppl): [id number]. A HCT value >55% may artifactually increase the PT.  In one study,  the increase was an average of 25%. Reference:  "Effect on Routine and Special Coagulation Testing Values of Citrate Anticoagulant Adjustment in Patients with High HCT Values." American Journal of Clinical Pathology 2006;126:400-405.)  Activated PTT (APTT) 28.4 (A HCT value >55% may  artifactually increase the APTT. In one study, the increase was an average of 19%. Reference: "Effect on Routine and Special Coagulation Testing Values of Citrate Anticoagulant Adjustment in Patients with High HCT Values." American Journal of Clinical Pathology 2006;126:400-405.)  Routine Hem:  28-Oct-15 09:29   WBC (CBC)  10.8  RBC (CBC) 4.79  Hemoglobin (CBC) 16.3  Hematocrit (CBC) 48.7  Platelet Count (CBC) 228 (Result(s) reported on 13 Feb 2014 at 09:43AM.)  MCV  102  MCH  34.1  MCHC 33.5  RDW 13.6   EKG:  EKG Interp. by me   Interpretation NSR, 65, left atrial enlargement, left axis deviation, no st/t changes   Radiology Results: XRay:    28-Oct-15 09:53, Chest Portable Single View  Chest Portable Single View   REASON FOR EXAM:    Chest pain  COMMENTS:       PROCEDURE: DXR - DXR PORTABLE CHEST SINGLE VIEW  - Feb 13 2014  9:53AM     CLINICAL DATA:  Shortness of breath, chest pain.    EXAM:  PORTABLE CHEST - 1 VIEW    COMPARISON:  July 03, 2013.    FINDINGS:  The heart size and mediastinal contours are within normal limits.  Both lungs are clear. No pneumothorax or pleural effusion is noted.  Sternotomy wires are noted. The visualized skeletal structures are  unremarkable.     IMPRESSION:  No acute cardiopulmonary abnormality seen.      Electronically Signed    By: Sabino Dick M.D.    On: 02/13/2014 09:56         Verified By: Marveen Reeks, M.D.,    No Known Allergies:    Impression 59 year old male with an extensive cardiac history having had his first MI at age 12, CAD s/p multiple PCIs (primarily followed at [facility] - records not on [place]), last cardiac cath 09/2012 at [facility] for class III-IV angina. No PCI was performed. Also a history of HTN, HLD, OSA on CPAP, ongoing tobacco abuse, and GERD who last saw his primary cardiologist both 2 months ago and a couple of weeks ago 2/2 increasing exertional chest pain. No further evaluation was  performed. He presents to [facility] today with the above as well as a 2 day history of worsening exertional right sided chest pain.   1. Unstable angina: -Patient's sympoms have been increasing in severity over the past 2 months and ultimately significantly worsened over the past 2 days prompting him to come into  [place] for evaluation and treatment -Given his increase in exertional symptoms, significant PMHx, and family Hx, nuclear stress testing is not the best option for him, even if his TnI were to remain negative. We would not be able to fully trust those results.  -The best option for him at this time given his symptoms, PMHx, and family Hx is a cardiac cath (will be scheduled when Dr. Fletcher Anon can perform radial cath 2/2 right sided femoral stenting/bypass, left sided femoral scar tissue)  - this was discussed in detail with him including the risks and benefits (including risk of bleeding, infection, kidney damage, stroke, and death) he is willing to proceed. He understands there may not be any large vessel pathology and medical management may be recommended.  -Currently chest pain free -Continue heparin gtt -Continue nitro gtt -Plan to restart his Toprol-XL 100 mg (if BP allows) once nitro gtt can be discontinued -Will hydrate today to improve SCr, limit dye during study -Check an echo to assess LV function  -Received aspirin 324 mg in the ED -Continue Ranexa 1000 mg bid  2. CAD s/p multiple PCIs: -Last cath 09/2012 without PCI - small vessel disease, medical management recommended -Continue aspirin 81 mg -Continue Plavix 75 mg daily, consider P2Y12 assay if significant in-stent restenosis -Coninue Lipitor 80 mg - check FLP, goal LDL <70 -Must stop smoking  3. HTN: -Continue current meds  4. Ongoing tobacco use; -Cessation advised  5. HLD: -FLP checked -Continue Lipitor 80 mg   Electronic Signatures for Addendum Section:  Leonie Man (MD) (Signed Addendum 29-Oct-15 00:32)  I have  seen and examined the patient this evening along  with Mr. Idolina Primer, [place]. I rreviewed the data. The patient has a long-standing history of significant coronary disease with CABG  and PCI. It  would be very helpful to get his last cath report from [facility].  At this  point, I think tthe only viable option is to proceed with cardiac catheterization in order to exclude any potential PCI option to treat his pain --   which must be considered Class IV /unstable angina.   otherwise I agreee with the  note  as written above.  [place]   Electronic Signatures: Rise Mu (PA-C)  (Signed 28-Oct-15 14:26)  Authored: General Aspect/Present Illness, History and Physical Exam, Review of System, Family & Social History, Past Medical History, Home Medications, Labs, EKG , Radiology, Allergies, Impression/Plan Leonie Man (MD)  (Signed 29-Oct-15 00:32)  Co-Signer: General Aspect/Present Illness, History and Physical Exam, Review of System, Family & Social History, Past Medical History, Home Medications, Labs, EKG , Radiology, Allergies, Impression/Plan   Last Updated: 29-Oct-15 00:32 by Leonie Man (MD)

## 2014-08-10 NOTE — Discharge Summary (Signed)
PATIENT NAME:  Dylan BurkittCHAVIS, Maximus D MR#:  [id number] DATE OF BIRTH:  04/02/1956  DATE OF ADMISSION:  05/09/2013 DATE OF DISCHARGE:  05/10/2013  For a detailed note, please take a look at the history and physical done on admission by Dr. Randol KernElgergawy.   DIAGNOSES AT DISCHARGE: As follows: Chest pain with elevated troponin, likely secondary to chronic stable angina. Acute coronary syndrome ruled out. History of coronary artery disease, hypertension, hyperlipidemia, gastroesophageal reflux disease, depression.   DIET: The patient is being discharged on a low-sodium, low-fat diet.   ACTIVITY: As tolerated.   [facility] in the next 1 to 2 weeks.   DISCHARGE MEDICATIONS: Hydrochlorothiazide 25 mg daily, ramipril 5 mg daily, metoprolol succinate 100 mg b.i.d., aspirin 81 mg daily, Protonix 40 mg daily, Plavix 75 mg daily, promethazine 25 mg q.6 hours as needed, nitroglycerin transdermal patch daily, Zoloft 50 mg daily, Imdur 90 mg daily, atorvastatin 40 mg at bedtime, fenofibrate 48 mg tablet daily, amlodipine 2.5 mg 3 tabs daily.   CONSULTANTS DURING HOSPITAL COURSE: Dr. Lorine BearsMuhammad Arida from cardiology.   PERTINENT STUDIES DONE DURING THE HOSPITAL COURSE: As follows: A CT scan of the abdomen and pelvis done with contrast on admission showing no acute abnormality, diffuse coronary calcification, small hepatic cyst. Serial troponins done, to be peaking as high as 0.13. Triglyceride level to be 2225, total cholesterol to be 256, HDL to be 19.   HOSPITAL COURSE: This is a 59 year old male with medical problems as mentioned above, presented to the hospital with chest pain and elevated troponin suspicious for acute coronary syndrome.  1.  Chest pain with elevated troponin: This was likely secondary to chronic stable angina and without any evidence of acute coronary syndrome. The patient's troponins did not trend up. He is currently chest pain-free and hemodynamically stable. He has had a recent  aggressive work-up for his coronary artery disease with a recent stress test and a cardiac cath done within the past few months, which showed non-significant CAD, not amenable to intervention. During this hospitalization, the patient was seen by cardiology, who did not think that the patient needed any acute cardiac intervention but likely medical management. His Imdur dose was increased from 60 mg to 90 mg, and his Norvasc was increased from 5 to 7.5. He will be discharged on the medications with close [facility]up [facility].  2.  Hyperlipidemia and hypertriglyceridemia: The patient had a lipid profile checked here. His triglycerides are over 1100. His serum cholesterol is over 250. Unclear if he is compliant with his statin. He is on atorvastatin already. I reduced his dose from 80 mg to 40 mg and added a fibrate given his hypertriglyceridemia. His lipid panel likely needs to be checked in the next 4 to 6 weeks.  3.  Hypertension: The patient remained hemodynamically stable. He will continue his Toprol, Imdur, Norvasc, and ramipril as stated.  4.  Depression: The patient was maintained on his Zoloft. He will resume that.  5.  GERD: The patient was maintained on his Protonix, and he will also resume that upon discharge.  6.  The patient is a full code.   TIME SPENT WITH THE DISCHARGE: 40 minutes.   ____________________________ Rolly PancakeVivek J. Cherlynn KaiserSainani, MD vjs:jcm D: 05/10/2013 17:07:36 ET T: 05/10/2013 21:11:03 ET JOB#: [id number]  cc: Rolly PancakeVivek J. Cherlynn KaiserSainani, MD, <Dictator> [facility] [place] SirenVIVEK J SAINANI MD ELECTRONICALLY SIGNED 05/16/2013 11:29

## 2014-08-10 NOTE — H&P (Signed)
PATIENT NAME:  Dylan Whitaker, Dylan Whitaker MR#:  [id number] DATE OF BIRTH:  [birth year]  DATE OF ADMISSION:  05/08/2013   PRIMARY CARE PHYSICIAN: None. He is following with [facility] for all of his cardiology issues and other heath issues.  CHIEF COMPLAINT: Diarrhea, abdominal pain and chest pain.   HISTORY OF PRESENT ILLNESS: This is a 59 year old male with known history of coronary artery disease, status post CABG and multiple stents in the past, who presents with multiple complaints, mainly abdominal pain and diarrhea for the last 24 hours, as well as prior to presentation, he presented with complaints of chest pain. Reports nausea but denies any vomiting, any fever and chills. Reports diarrhea has been all day. Reports to have chest pain, midsternal, nonradiating prior to presentation. The patient had CT abdomen and pelvis with contrast that did not show any acute findings. The patient was found to have elevated troponin at 0.12. The patient reports he had multiple stents done in the past, and most recent a couple of months ago, when he was [facility] when he had cardiac cath put, where he was found to have low-grade disease in multiple small vessels, where no intervention was done, reports that his pain resembles previous heart attacks. Hospitalist  service was requested to admit the patient for treatment for his non-STEMI.   PAST MEDICAL HISTORY: 1.  Cardiac disease.  2.  Hypertension.  3.  GERD.  4.  Hyperlipidemia.    PAST SURGICAL HISTORY:  1.  Carotid stent. 2.  Cardiac bypass.    SOCIAL HISTORY: The patient still smokes 1/2 pack per day. Reports occasional alcohol use. No illicit drug use.   FAMILY HISTORY: Significant for coronary artery disease at young age in the family.   ALLERGIES: NO KNOWN DRUG ALLERGIES.   HOME MEDICATIONS: 1.  Aspirin 81 mg daily.  2.  Ramipril 5 mg daily.  3.  Isosorbide mononitrate 20 mg b.i.Whitaker.  4.  Sublingual nitroglycerin as needed.  5.   Zoloft 50 mg oral 2 times a day.  6.  Plavix 75 mg daily.  7.  Atorvastatin 80 mg at bedtime.  8.  Zolpidem 10 mg nightly.  9.  Metoprolol extended release 100 mg oral 2 times a day.  10.  Amlodipine 5 mg oral daily.  11.  Pantoprazole 40 mg oral daily.  12.  Hydrochlorothiazide 25 mg oral daily.    REVIEW OF SYSTEMS: CONSTITUTIONAL:  Denies any fever, any chills, any fatigue, any weakness, any weight gain and no weight loss.  EYES: Denies blurry vision, double vision, inflammation, glaucoma.  ENT: Denies tinnitus, ear pain, hearing loss, epistaxis.  RESPIRATORY: Denies cough, wheezing, hemoptysis. Complains of dyspnea.  CARDIOVASCULAR: Reports chest pain. Denies edema, arrhythmia, palpitations, syncope.  GASTROINTESTINAL: Reports nausea, diarrhea, abdominal pain. Denies any vomiting, constipation, hematemesis, melena, rectal bleed.  GENITOURINARY: Denies dysuria, hematuria, renal colic.  ENDOCRINE: Denies polyuria, polydipsia, heat or cold intolerance.  HEMATOLOGY: Denies anemia, easy bruising, bleeding diathesis.  INTEGUMENTARY: Denies acne, rash or skin lesion.  MUSCULOSKELETAL: Denies any swelling, gout or arthritis.  NEUROLOGIC: Denies CVA, TIA, headache, ataxia.  PSYCHIATRIC: Denies anxiety, insomnia or depression.   PHYSICAL EXAMINATION: VITAL SIGNS: Temperature 97.7, pulse 84, respiratory rate 20, blood pressure 122/75, saturating 97% on room air.  GENERAL: Well-nourished male who looks comfortable in bed, in no apparent distress.  HEENT: Head atraumatic, normocephalic. Pupils equal, reactive to light. Pink conjunctivae. Anicteric sclerae. Moist oral mucosa.  NECK: Supple. No thyromegaly. No JVD.  CHEST: Good air entry bilaterally. No wheezing, rales, rhonchi.  CARDIOVASCULAR: S1, S2 heard. No rubs, murmur or gallops.  ABDOMEN: Soft, nontender, nondistended. Bowel sounds present.  EXTREMITIES: No edema. No clubbing. No cyanosis.  PSYCHIATRIC: Appropriate affect. Awake, alert  x3. Intact judgment and insight.  NEUROLOGIC: Cranial nerves grossly intact. Motor 5/5. No focal deficits.  SKIN: Normal skin turgor. Warm and dry.  LYMPHATIC: No cervical lymphadenopathy could be appreciated.  MUSCULOSKELETAL: No joint effusion or erythema.   PERTINENT LABORATORY DATA: Glucose 112, BUN 20, creatinine 1.42, sodium 142, potassium 3.7, chloride 106, CO2 24, troponin 0.12. ALT 34, AST 21, alkaline phosphatase 59. White blood cells 6.7, hemoglobin 15, hematocrit 44.8, platelets 163. EKG showing normal sinus rhythm, ventricular rate, 79 beats per minute, no significant ST or T wave abnormalities.   ASSESSMENT AND PLAN: 1.  Non-ST elevated myocardial infarction, in a patient with known history of coronary artery disease, who presents with complaints of chest pain, has positive troponin. He will be admitted to telemetry unit. We will start him on subcutaneous Lovenox 12 mg/kg, and will give him 324 mg of aspirin. He is already on an ACE inhibitor, on beta blockers and on statin. We will consult cardiology. Will check 2-Whitaker echocardiogram.  2.  Hypertension. Blood pressure acceptable. Continue with home meds.  3.  Hyperlipidemia. Continue with statin.  4.  Tobacco abuse. The patient was counseled.  5.  Gastroesophageal reflux disease. Continue with PPI.  6.  History of carotid stenosis status post stent. Continue with Plavix.  7.  History of coronary artery disease. The patient is on optimal medications, including nitroglycerin, aspirin, statin, beta blockers and ACE inhibitor.  8.  Deep vein thrombosis prophylaxis. The patient is on full-dose anticoagulation.   CODE STATUS: Full code.   TOTAL TIME SPENT ON ADMISSION AND PATIENT CARE: 50 minutes.    ____________________________ Starleen Arms, MD dse:cg Whitaker: 05/09/2013 04:19:49 ET T: 05/09/2013 04:55:58 ET JOB#: [id number]  cc: Starleen Arms, MD, <Dictator> [place] [facility] Teena Irani MD ELECTRONICALLY SIGNED 05/16/2013 23:56

## 2014-08-10 NOTE — Discharge Summary (Signed)
PATIENT NAME:  Dylan Whitaker, Dylan Whitaker MR#:  [id number] DATE OF BIRTH:  10/13/1955  DATE OF ADMISSION:  07/03/2013 DATE OF DISCHARGE:  07/04/2013  PRIMARY CARE PHYSICIAN:  Nonlocal.  CONSULTATION:  Cardiology, Dr. Mariah MillingGollan.   DISCHARGE DIAGNOSES:  1.  Unstable angina with coronary artery disease.  2.  Hypertension.   CONDITION:  Stable.   CODE STATUS:  FULL CODE.   HOME MEDICATIONS:  [facility] physician discharge instruction medication reconciliation list.  New medications:  1.  Ranexa 1000 mg by mouth twice daily.  2.  Fenofibrate 48 mg by mouth daily was discontinued by cardiology.   DIET:  Low-sodium, low-fat, low-cholesterol diet.   ACTIVITY:  As tolerated.   FOLLOW-UP CARE:  Follow with PCP within 1 to 2 weeks.  Follow up with [facility] cardiologist this Friday.   REASON FOR ADMISSION:  Chest pain   HOSPITAL COURSE:  The patient is a 59 year old Caucasian male with a history of CAD with multiple stents, also chronic stable angina who presented in the ED with chest pain and because of his continued chest pain in ED the patient was started on nitro drip and heparin drip.  The patient's first set of troponin level was negative.  For a detailed history and physical examination, please refer to the admission note dictated by Dr. Nemiah CommanderKalisetti.  On admission date, the patient's chest x-ray is clear.  EKG showed normal sinus rhythm at 75 bpm.  CBC in normal range.  Electrolytes are normal.  1.  Chest pain, possible due to unstable angina.  After admission the patient's troponin level has been negative.  Heparin drip and nitro drip was discontinued.  Dr. Windell HummingbirdGollan's PA evaluated the patient, has suggested continue current treatment, discontinue fenofibrate.  No further work-up.  2.  Hypertension, has been controlled.  The patient had chest pain.  It resolved.  He has no complaints.  Vital signs are stable.   PHYSICAL EXAMINATION:  Unremarkable.   The patient is clinically stable, will be  discharged to home today.  I discussed the patient's discharge plan with the patient, nurse, case manager.   TIME SPENT:  About 35 minutes.    ____________________________ Dylan PollackQing Gloris Shiroma, MD qc:ea Whitaker: 07/04/2013 17:13:55 ET T: 07/05/2013 00:40:47 ET JOB#: [id number]  cc: Dylan PollackQing Verginia Toohey, MD, <Dictator> Dylan PollackQING Spenser Cong MD ELECTRONICALLY SIGNED 07/06/2013 14:27

## 2014-08-10 NOTE — Consult Note (Signed)
General Aspect Dylan Whitaker is a 59yo Caucasian male w/ an extensive cardiac history, HTN, HLD, OSA on CPAP, ongoing tobacco abuse and GERD. Cardiology consulted for elevated troponins.   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 He has had multiple PCIs since. He is primarily followed at [facility]. He had a heart catheterization ~ 2 months ago at [facility] for what sounds like class III-IV angina. No PCI was performed. He was told he has small vessel disease. Antianginals have been started. He had a nuclear stress test and echo at [facility] ~ 1 month ago. No further work-up was pursed. He was told the stress test was normal. He has been told he has heart failure in the past. No issues w/ volume overload or dyspnea. Never told he needs an ICD. He reports trying Ranexa for 4-5 months previously. This was stopped  due to cost and as he did not experience significant relief.   He reports experiencing diffuse abdominal discomfort 1-2 days ago with associated watery diarrhea. No bloody BMs. Around this time, he developed L sided chest pressure radiating to his jaw lasting 15-20 minutes, characteristic of his prior angina. He states has been worsening for the past 3-6 months, preceding his most recent cath and stress tests. He denies fevers, chills, change in stool quality, PND, orthopnea, LE edema, DOE/SOB. He admits he needs to follow-up with a PCP.   Present Illness EKG in the ED showed no ischemic changes. CMP- BUN 20/Cr 1.42. LFTs, lipase WNL. Abdominal CT- CAD, aortic atherosclerosis, no acute abnormalities. Admitted by medicine. VSS- afebrile. TnI trend 0.12-0.13-0.11.   PAST MEDICAL HISTORY: 1.  Cardiac disease.  2.  Possible ischemic cardiomyopathy. 3.  Hypertension.  4.  GERD.  5.  Hyperlipidemia.    PAST SURGICAL HISTORY:  1.  Carotid stent. 2.  Cardiac bypass.    SOCIAL HISTORY: The patient still smokes 1/2 pack per day. Reports occasional alcohol use. No illicit drug use.   FAMILY HISTORY:  Significant for coronary artery disease at young age in the family.   ALLERGIES: NO KNOWN DRUG ALLERGIES.   HOME MEDICATIONS: 1.  Aspirin 81 mg daily.  2.  Ramipril 5 mg daily.  3.  Isosorbide mononitrate 20 mg b.i.d.  4.  Sublingual nitroglycerin as needed.  5.  Zoloft 50 mg oral 2 times a day.  6.  Plavix 75 mg daily.  7.  Atorvastatin 80 mg at bedtime.  8.  Zolpidem 10 mg nightly.  9.  Metoprolol extended release 100 mg oral 2 times a day.  10.  Amlodipine 5 mg oral daily.  11.  Pantoprazole 40 mg oral daily.  12.  Hydrochlorothiazide 25 mg oral daily.   Physical Exam:  GEN no acute distress, cachectic   HEENT pink conjunctivae, PERRL, hearing intact to voice   NECK supple  No masses  trachea midline  no JVD or bruits   RESP normal resp effort  clear BS  no use of accessory muscles  no wheezes, rales or rhonchi   CARD Regular rate and rhythm  Normal, S1, S2  No murmur   ABD denies tenderness  distended  hyperactive BS   EXTR negative cyanosis/clubbing, negative edema   SKIN normal to palpation, skin turgor good   NEURO follows commands, motor/sensory function intact   PSYCH alert, A+O to time, place, person   Review of Systems:  Subjective/Chief Complaint abdominal pain, diarrhea, chest pain   Cardiovascular: Chest pain or discomfort  Tightness  Gastrointestinal: Nausea  Diarrhea   Review of Systems: All other systems were reviewed and found to be negative   Lab Results:  Hepatic:  21-Jan-15 00:17   Bilirubin, Total 0.3  Alkaline Phosphatase 59 (45-117 NOTE: New Reference Range 03/09/13)  SGPT (ALT) 34  SGOT (AST) 21  Total Protein, Serum 7.3  Albumin, Serum 3.7  Routine Chem:  21-Jan-15 00:17   Result Comment TROPONIN - RESULTS VERIFIED BY REPEAT TESTING.  - C/ SARAH OLEJAR @0148  05-09-13 BY AJO  - READ-BACK PROCESS PERFORMED.  Result(s) reported on 09 May 2013 at 01:51AM.  Ethanol, S. 34  Ethanol % (comp) 0.034 (Result(s) reported on 09 May 2013 at 01:52AM.)  Glucose, Serum  112  BUN  20  Creatinine (comp)  1.42  Sodium, Serum 142  Potassium, Serum 3.7  Chloride, Serum 106  CO2, Serum 24  Calcium (Total), Serum 9.1  Osmolality (calc) 286  eGFR (African American) >60  eGFR (Non-African American)  54 (eGFR values <37m/min/1.73 m2 may be an indication of chronic kidney disease (CKD). Calculated eGFR is useful in patients with stable renal function. The eGFR calculation will not be reliable in acutely ill patients when serum creatinine is changing rapidly. It is not useful in  patients on dialysis. The eGFR calculation may not be applicable to patients at the low and high extremes of body sizes, pregnant women, and vegetarians.)  Anion Gap 12  Lipase 343 (Result(s) reported on 09 May 2013 at 01:09AM.)    04:44   Result Comment TROPONIN - RESULTS VERIFIED BY REPEAT TESTING.  - PREV. C/ 05-09-13 @0148  BY AJO. AJO  Result(s) reported on 09 May 2013 at 06:12AM.    08:38   Result Comment TROPONIN - RESULTS VERIFIED BY REPEAT TESTING.  - ELEVATED TROPONIN PREVIOUSLY CALLED ON  - 05-09-13 AT 0148.VKB  Result(s) reported on 09 May 2013 at 09:50AM.  Cardiac:  21-Jan-15 00:17   Troponin I  0.12 (0.00-0.05 0.05 ng/mL or less: NEGATIVE  Repeat testing in 3-6 hrs  if clinically indicated. >0.05 ng/mL: POTENTIAL  MYOCARDIAL INJURY. Repeat  testing in 3-6 hrs if  clinically indicated. NOTE: An increase or decrease  of 30% or more on serial  testing suggests a  clinically important change)  CK, Total  238  CPK-MB, Serum 1.7 (Result(s) reported on 09 May 2013 at 01:09AM.)    04:44   Troponin I  0.13 (0.00-0.05 0.05 ng/mL or less: NEGATIVE  Repeat testing in 3-6 hrs  if clinically indicated. >0.05 ng/mL: POTENTIAL  MYOCARDIAL INJURY. Repeat  testing in 3-6 hrs if  clinically indicated. NOTE: An increase or decrease  of 30% or more on serial  testing suggests a  clinically important change)  CK, Total 179  CPK-MB,  Serum 1.5 (Result(s) reported on 09 May 2013 at 0[facility])    08:38   Troponin I  0.11 (0.00-0.05 0.05 ng/mL or less: NEGATIVE  Repeat testing in 3-6 hrs  if clinically indicated. >0.05 ng/mL: POTENTIAL  MYOCARDIAL INJURY. Repeat  testing in 3-6 hrs if  clinically indicated. NOTE: An increase or decrease  of 30% or more on serial  testing suggests a  clinically important change)  CK, Total 176  CPK-MB, Serum 1.5 (Result(s) reported on 09 May 2013 at 09:50AM.)  Routine Hem:  21-Jan-15 00:17   WBC (CBC) 6.7  RBC (CBC)  4.33  Hemoglobin (CBC) 15.0  Hematocrit (CBC) 44.8  Platelet Count (CBC) 163 (Result(s) reported on 09 May 2013 at 12:35AM.)  MCV  103  MCH  34.7  MCHC 33.6  RDW  14.6   EKG:  Interpretation NSR, LAD, no ST/T changes   Rate 79    No Known Allergies:   Vital Signs/Nurse's Notes: **Vital Signs.:   21-Jan-15 06:00  Vital Signs Type Admission  Temperature Temperature (F) 97.6  Celsius 36.4  Temperature Source oral  Pulse Pulse 74  Respirations Respirations 18  Systolic BP Systolic BP 300  Diastolic BP (mmHg) Diastolic BP (mmHg) 84  Mean BP 101  Pulse Ox % Pulse Ox % 96  Pulse Ox Activity Level  At rest  Oxygen Delivery Room Air/ 21 %    Impression 59yo Caucasian male w/ an extensive cardiac history, HTN, HLD, OSA on CPAP, ongoing tobacco abuse and GERD. Cardiology consulted for elevated troponins.   1. Elevated troponins/CAD/chronic angina, CCS class III The patient has class III angina at baseline. Recent ischemic work-up in cath [facility] 2 months ago, stress test at [facility] 1 month ago, showed nonobstructive CAD. He has told small vessel dz is attributing. Will need to obtain these records. 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 If prior studies indicate no options for revascularization, would need to focus on antianginal therapy.  He is on a significant amount of beta blocker.  -- Obtain ischemic work-up records -- Could increase amlodipine to 88m or Imdur to 926mor BID dosing (may have developed a tolerance to nitrates). Has room w/ BP. -- Retrial of ranolazine- consult case management for affordability assistance -- ? External counterpulsation therapy if pharmacology suboptimal -- Control secondary factors- stop smoking, repeat sleep study to recalibrate CPAP, limit EtOH intake -- Continue ASA, Plavix, ACEi, BB, high-potency statin, antianginals -- Check TSH  2. Possible ischemic cardiomyopathy Has been told he has heart failure previously. [facility] "[facility]." Denies SOB/DOE, PND, orthopnea, LE edema. 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 Not needing a diuretic.  [facility] Abdominal pain, diarrhea Question viral gastroenteritis. Abdominal CT w/o acute process.  -- Supportive therapy per primary team  4. Aortic atherosclerosis Per abdominal CT. -- Continue to monitor  5. Hypertension Normotensive. -- Continue current antihypertensives  6. Hyperlipidemia -- Continue atorvastatin 80   Electronic Signatures for Addendum Section:  HaLeonie ManMD) (Signed Addendum 21-Jan-15 16:22)  I have seen & examined the patient along with Mr. ArEverlean CherryPAUtah I agree with his findings, exam, impressions & recommendations.  [age]/o man with long-standing CAD-CABG wtih chronic Stable Angina ~ Class II-III.  [facility]   He presented to the ER with c/o GI upset & diarrhea.  Noted to be mildly dehydrated.  With ROS indiicating Angina, we were consulted -- Troponin minimally elevated with no ACS trend.  He does not note any really significant increase in Anginal Pain.  Unfortunately medical therapy seems to be his only option & is not all that effective.  Unable to take Ranexa (his best  option pharmacologically for microvascular ischemia) due to $$ issues.   Exam is essentially unrevealing.  At this point, titrating up his Nitrate +/- Amlodipine are the best options for Angina control.  He even uses PRN narcotics for Angina relief. He does not seem overly concerned with his current level of Angina, and is mroe concerned  with diarrhea. The likely etiology for mild exacerbation of angina & mild troponin elevation is stress related microvascular ischemia in setting of diarrhea & dehydration.  Cr elevation suggest mild renal impairment which could lead to decreased Troponin clearance.  Recommend: Continue medication titration. Treat diarrhea.  He needs to schedule f/u with his primary cardiologist @ [facility] on d/c to discuss additional therapeutic options for his stable angina.   Electronic Signatures: Meriel Pica (PA-C)  (Signed 21-Jan-15 10:56)  Authored: General Aspect/Present Illness, History and Physical Exam, Review of System, Home Medications, Labs, EKG , Allergies, Vital Signs/Nurse's Notes, Impression/Plan Leonie Man (MD)  (Signed 21-Jan-15 16:22)  Co-Signer: General Aspect/Present Illness, History and Physical Exam, Review of System, Home Medications, Labs, EKG , Allergies, Vital Signs/Nurse's Notes, Impression/Plan   Last Updated: 21-Jan-15 16:22 by Leonie Man (MD)

## 2014-08-10 NOTE — Discharge Summary (Signed)
PATIENT NAME:  Dylan Whitaker, Dylan Whitaker MR#:  [id number] DATE OF BIRTH:  1956-01-26  DATE OF ADMISSION:  02/13/2014 DATE OF DISCHARGE:  02/14/2014  ADMITTING PHYSICIAN: Dr. Elpidio Anis   DISCHARGING PHYSICIAN: Enid Baas, MD  The patient left AMA on February 14, 2014.   CONSULTATIONS IN THE HOSPITAL: Cardiology consultation by Dr. Julien Nordmann.  PRIMARY CARE PHYSICIAN: [facility].   DISCHARGE DIAGNOSES:  1.  Chronic unstable angina.  2.  Coronary artery disease status post bypass graft surgery and multiple stents.  3.  Gastroesophageal reflux disease.  4.  Hypertension.  5.  Hyperlipidemia.  6.  Tobacco use disorder.  7.  Peripheral vascular disease.  DISCHARGE HOME MEDICATIONS: No changes have been made to the patient's medications before he left as he left AMA. His home medications are: 1.  Norvasc 5 mg p.o. daily.  2.  TriCor 48 mg p.o. daily.  3.  Lasix 40 mg p.o. daily.  4.  Imdur 60 mg p.o. daily.  5.  Lipitor 80 mg p.o. daily.  6.  Lyrica 50 mg 2 capsules once a day.  7.  Metoprolol 100 mg extended release tablet daily.  8.  Nifedipine 30 mg p.o. daily.  9.  Nitroglycerin 0.2 mg patch daily.  10.  Oxycodone 10 mg daily p.r.n.  11.  Protonix 40 mg p.o. daily.  12.  Plavix 75 mg daily.  13.  Promethazine 25 mg q. 6 hours p.r.n. for nausea.  14.  Ranexa 1000 mg p.o. b.i.d.  DISCHARGE DIET: Low-sodium diet.   DISCHARGE ACTIVITY: As tolerated.   FOLLOWUP INSTRUCTIONS: Cardiology follow-up in 1 week.   DIAGNOSTIC DATA: Labs and imaging studies prior to discharge: WBC 9.9, hemoglobin 14.3, hematocrit 42.1, platelet count 176,000.   Sodium 140, potassium 3.5, chloride 103, bicarb 31, BUN 22, creatinine 1.5, glucose 105, and calcium 7.9. LDL cholesterol elevated. Triglycerides 441, total cholesterol 135, HDL 30.   Echo Doppler showed LV ejection fraction is 50% to 55%, normal systolic function, septal motion consistent with prior CABG and conduction abnormality.  Impaired relaxation pattern of LV diastolic filling is noted.   Cardiac catheterization revealed severe 3 vessel coronary artery disease with known occluded SVG, patent LIMA to LAD and left circumflex stents. Chronically occluded right coronary artery and no change in anatomy since most recent cardiac catheterization last year. The etiology of chest pain does not seem to be coronary artery disease. Advised to stop the nitroglycerin and heparin drips at this point.   BRIEF HOSPITAL COURSE: Dylan Whitaker is a 59 year old male with past medical history significant for coronary artery disease status post bypass graft surgery, multiple stents, chronic stable angina, on Ranexa and nitrate medications, who also has history of chronic pain presents to the hospital secondary to uncontrollable chest pain even at rest.  1.  Admitted for possible unstable angina. The patient says he was seen by his cardiologist 2 months ago and also 2 weeks ago at [facility] for his ongoing chest pain and they have not recommended any stress test. His pain usually is 3/10, constant, dull, heavy pain, but since pain has been more persistent at 7/10, and he presented to the hospital. Because of his extensive coronary history, history of doing stress test, and his troponins are negative, cardiac catheterization was done and it shows chronic occlusions, but no new changes. Medical management was recommended and the patient's chest pain was thought to be noncardiac in nature at this time. He was on heparin drip and nitro drip when  he came in for his chest pain; however, those were discontinued after the cardiac catheterization. The patient was supposed to be discharged the day after the cath after watching his kidney function because he has mild renal dysfunction. The patient however was not happy with the way cardiac cath was proceeded and when he was told that he did not have any blockages the patient actually left against medical advice. He was  advised to follow up with his primary cardiologist.   ADDITIONAL TIME SPENT: 35 minutes.   ____________________________ Enid Baasadhika Melika Reder, MD rk:sb D: 02/19/2014 10:51:25 ET T: 02/19/2014 12:01:42 ET JOB#: [id number]  cc: Enid Baasadhika Ramel Tobon, MD, <Dictator> Enid BaasADHIKA Twinkle Sockwell MD ELECTRONICALLY SIGNED 03/09/2014 14:41

## 2014-08-10 NOTE — H&P (Signed)
PATIENT NAME:  Dylan Whitaker, Dylan Whitaker MR#:  [id number] DATE OF BIRTH:  1955/10/13  DATE OF ADMISSION:  02/13/2014  [facility] along with his cardiologist.   CHIEF COMPLAINT:  Left-sided chest pain.   HISTORY OF PRESENT ILLNESS:  A 59 year old Caucasian male patient with history of CAD status post bypass, multiple PCIs and chronic stable angina with daily chest pain, presents to the hospital with 1 day of continuous left-sided chest pain with no aggravating or relieving factors. The patient woke up yesterday with this left-sided chest pain. This has not resolved. He tried Nitropaste at home with no improvement. In the Emergency Room he was started on a nitro drip along with heparin drip. Presently his pain is significantly improved, although still present. He also got a dose of Dilaudid earlier.  The patient mentions that he had had chest pain for many years lasting seconds up to 1/2 hour every day, but this time it is much more intense. He was concerned about having a new blockage in his heart and presented to the Emergency Room.   The patient was last seen here in the hospital in March 2015 with similar pain. Initially, started on nitro drip, heparin, later this was discontinued as his troponins were normal. The patient tells me that his a last PCI was in October 2013 when he had a heart attack. He does not remember getting another cardiac catheterization later but according to cardiology notes from recent admission his last cardiac catheterization was June 2014 for class III-IV angina with no PCI. He also had a nuclear stress test and an [facility] in December 2014, which were normal with preserved ejection fraction.   During previous admission in March 2015 his pain was thought to be likely secondary from small vessel disease with ongoing smoking and the patient was discharged home with normal cardiac enzymes as the patient had [facility].   PAST MEDICAL HISTORY:  1.  CAD, status post multiple stents, CABG.  2.  Chronic stable angina.  3.  GERD.  4.  Hypertension.  5.  Hyperlipidemia.  6.  Tobacco abuse.   PAST SURGICAL HISTORY:  1.  Multiple cardiac stents.  2.  Bypass.  3.  Right leg arterial graft repair for his bypass.   ALLERGIES: NO KNOWN DRUG ALLERGIES.   HOME MEDICATIONS:  1.  Amlodipine 10 mg 1/2 tablet daily.  3.  Lasix 40 mg daily.  4.  Isosorbide mononitrate 60 mg daily.  5.  Lipitor 80 mg daily.  6.  Lyrica 50 mg 2 capsules orally once a day.  7.  Metoprolol 100 mg extended-release daily.  8.  Nifedipine 30 mg oral daily.  9.  Nitroglycerin 0.2 mg patch daily.  10. Oxycodone 10 mg oral once a day as needed.  11. Protonix 40 mg daily.  12. Plavix 75 mg daily.  13. Promethazine 25 mg oral every 6 hours as needed.  14. Ranexa 500 mg 2 tablets 2 times a day.   REVIEW OF SYSTEMS:  CONSTITUTIONAL: Complains of no fever, does have fatigue, weakness.  EYES: No blurred vision, pain or redness.  EARS, NOSE, THROAT:  No tinnitus, ear pain, hearing loss.  RESPIRATORY: No cough, wheeze, hemoptysis. CARDIOVASCULAR:  Has chest pain, no orthopnea, edema, arrhythmia.  GASTROINTESTINAL: No nausea, vomiting, diarrhea, abdominal pain.  GENITOURINARY: No dysuria, hematuria, frequency.  ENDOCRINE: No polyuria, nocturia, thyroid problems.  HEMATOLOGIC AND LYMPHATIC:  No anemia, easy bruising, bleeding.  INTEGUMENTARY: No acne, rash, lesion.  MUSCULOSKELETAL: No back pain, arthritis.  NEUROLOGIC: No focal numbness, weakness, seizure.  PSYCHIATRIC: No anxiety or depression.   PHYSICAL EXAMINATION:  VITAL SIGNS: Temperature 98.1, pulse of 68, blood pressure 156/92, saturating 100% on oxygen.  GENERAL: Obese, Caucasian male patient sitting up in bed, in distress from his chest pain.  PSYCHIATRIC:  He is alert and oriented x 3, anxious.   HEENT:  Atraumatic, normocephalic. Oral mucosa moist and pink. External  ears and nose normal. No pallor. No icterus. Pupils bilaterally equal and reactive to light.  NECK: Supple. No thyromegaly. No palpable lymph nodes. Trachea midline. No carotid bruit, JVD.  CARDIOVASCULAR: S1, S2, without any murmurs. Peripheral pulses 2+. No edema.  RESPIRATORY: Normal work of breathing. Clear to auscultation on both sides.  GASTROINTESTINAL: Soft abdomen, nontender. Bowel sounds present. No organomegaly palpable.  SKIN: Warm and dry. No petechiae, rash, ulcers.  MUSCULOSKELETAL: No joint swelling, redness, or effusion in large joints. Normal muscle tone.  NEUROLOGICAL:  Motor strength 5 over 5 in upper and lower extremities. Sensory intact all over.  LYMPHATIC: No cervical lymphadenopathy.   EKG: Shows normal sinus rhythm with inferior Q waves. EKG seems unchanged from prior EKG in March.   Chest x-ray, portable, shows no acute disease.   ASSESSMENT AND PLAN:  1.  Unstable angina in a patient with chronic angina. At this point the pain has lasted about 1 day but his cardiac enzymes are normal. We will repeat cardiac enzymes. His pain seems to have improved with nitro drip, which I will continue along with the heparin drip he has. He did have a normal cardiac catheterization in 2014 per cardiology notes. We will request records from [facility]. I have discussed the case with on-call cardiology who will be seeing the patient soon. At this point he will be admitted to step-down on telemetry. Check further cardiac enzymes, continue his aspirin and Plavix, statin and beta blocker. Currently pain is improved on the drip. Will check an echocardiogram.  2.  Hypertension. Continue medications.  3.  Tobacco abuse. The patient has been counseled to quit smoking for greater than 3 minutes.  4.  Chronic kidney disease, stage III, stable.  5.  Deep vein thrombosis prophylaxis. The patient is on a heparin drip.   CODE STATUS:  Full code.   Time spent today on this case: 40 minutes.     ____________________________ Molinda Bailiff Gari Trovato, MD srs:lt Whitaker: 02/13/2014 12:52:42 ET T: 02/13/2014 13:24:24 ET JOB#: [id number]  cc: Wardell Heath R. Elpidio Anis, MD, <Dictator> [facility] Orie Fisherman MD ELECTRONICALLY SIGNED 02/21/2014 14:56

## 2015-10-08 ENCOUNTER — Inpatient Hospital Stay
Admission: EM | Admit: 2015-10-08 | Discharge: 2015-10-09 | DRG: 282 | Payer: 59 | Attending: Specialist | Admitting: Specialist

## 2015-10-08 ENCOUNTER — Emergency Department: Payer: 59

## 2015-10-08 ENCOUNTER — Other Ambulatory Visit: Payer: Self-pay

## 2015-10-08 DIAGNOSIS — Z8673 Personal history of transient ischemic attack (TIA), and cerebral infarction without residual deficits: Secondary | ICD-10-CM

## 2015-10-08 DIAGNOSIS — Z95820 Peripheral vascular angioplasty status with implants and grafts: Secondary | ICD-10-CM | POA: Diagnosis not present

## 2015-10-08 DIAGNOSIS — Z951 Presence of aortocoronary bypass graft: Secondary | ICD-10-CM | POA: Diagnosis not present

## 2015-10-08 DIAGNOSIS — E781 Pure hyperglyceridemia: Secondary | ICD-10-CM | POA: Diagnosis present

## 2015-10-08 DIAGNOSIS — I2510 Atherosclerotic heart disease of native coronary artery without angina pectoris: Secondary | ICD-10-CM | POA: Diagnosis present

## 2015-10-08 DIAGNOSIS — R079 Chest pain, unspecified: Secondary | ICD-10-CM | POA: Diagnosis present

## 2015-10-08 DIAGNOSIS — I214 Non-ST elevation (NSTEMI) myocardial infarction: Principal | ICD-10-CM

## 2015-10-08 DIAGNOSIS — F1911 Other psychoactive substance abuse, in remission: Secondary | ICD-10-CM

## 2015-10-08 DIAGNOSIS — Z955 Presence of coronary angioplasty implant and graft: Secondary | ICD-10-CM | POA: Diagnosis not present

## 2015-10-08 DIAGNOSIS — I255 Ischemic cardiomyopathy: Secondary | ICD-10-CM | POA: Diagnosis present

## 2015-10-08 DIAGNOSIS — I739 Peripheral vascular disease, unspecified: Secondary | ICD-10-CM | POA: Diagnosis present

## 2015-10-08 DIAGNOSIS — Z8249 Family history of ischemic heart disease and other diseases of the circulatory system: Secondary | ICD-10-CM

## 2015-10-08 DIAGNOSIS — I200 Unstable angina: Secondary | ICD-10-CM | POA: Diagnosis present

## 2015-10-08 DIAGNOSIS — I10 Essential (primary) hypertension: Secondary | ICD-10-CM | POA: Diagnosis present

## 2015-10-08 DIAGNOSIS — G629 Polyneuropathy, unspecified: Secondary | ICD-10-CM | POA: Diagnosis present

## 2015-10-08 DIAGNOSIS — Z87898 Personal history of other specified conditions: Secondary | ICD-10-CM

## 2015-10-08 DIAGNOSIS — I252 Old myocardial infarction: Secondary | ICD-10-CM

## 2015-10-08 DIAGNOSIS — I129 Hypertensive chronic kidney disease with stage 1 through stage 4 chronic kidney disease, or unspecified chronic kidney disease: Secondary | ICD-10-CM | POA: Diagnosis present

## 2015-10-08 DIAGNOSIS — I4891 Unspecified atrial fibrillation: Secondary | ICD-10-CM | POA: Diagnosis present

## 2015-10-08 DIAGNOSIS — F1010 Alcohol abuse, uncomplicated: Secondary | ICD-10-CM | POA: Diagnosis present

## 2015-10-08 DIAGNOSIS — I25110 Atherosclerotic heart disease of native coronary artery with unstable angina pectoris: Secondary | ICD-10-CM | POA: Diagnosis present

## 2015-10-08 DIAGNOSIS — N182 Chronic kidney disease, stage 2 (mild): Secondary | ICD-10-CM | POA: Diagnosis present

## 2015-10-08 DIAGNOSIS — E785 Hyperlipidemia, unspecified: Secondary | ICD-10-CM | POA: Diagnosis present

## 2015-10-08 DIAGNOSIS — Z87891 Personal history of nicotine dependence: Secondary | ICD-10-CM

## 2015-10-08 HISTORY — DX: Peripheral vascular disease, unspecified: I73.9

## 2015-10-08 HISTORY — DX: Other psychoactive substance abuse, uncomplicated: F19.10

## 2015-10-08 HISTORY — DX: Hyperlipidemia, unspecified: E78.5

## 2015-10-08 HISTORY — DX: Chronic kidney disease, stage 2 (mild): N18.2

## 2015-10-08 HISTORY — DX: Atherosclerotic heart disease of native coronary artery without angina pectoris: I25.10

## 2015-10-08 HISTORY — DX: Cerebral infarction, unspecified: I63.9

## 2015-10-08 HISTORY — DX: Ischemic cardiomyopathy: I25.5

## 2015-10-08 HISTORY — DX: Essential (primary) hypertension: I10

## 2015-10-08 LAB — COMPREHENSIVE METABOLIC PANEL
ALBUMIN: UNDETERMINED g/dL (ref 3.5–5.0)
ALK PHOS: 54 U/L (ref 38–126)
ALT: UNDETERMINED U/L (ref 17–63)
AST: 34 U/L (ref 15–41)
Anion gap: UNDETERMINED (ref 5–15)
BILIRUBIN TOTAL: 0.6 mg/dL (ref 0.3–1.2)
BUN: 19 mg/dL (ref 6–20)
CALCIUM: UNDETERMINED mg/dL (ref 8.9–10.3)
CO2: UNDETERMINED mmol/L (ref 22–32)
CREATININE: 1.43 mg/dL — AB (ref 0.61–1.24)
Chloride: 101 mmol/L (ref 101–111)
GFR calc Af Amer: 60 mL/min — ABNORMAL LOW (ref >60)
GFR calc non Af Amer: 51 mL/min — ABNORMAL LOW (ref >60)
GLUCOSE: UNDETERMINED mg/dL (ref 65–99)
Potassium: UNDETERMINED mmol/L (ref 3.5–5.1)
SODIUM: UNDETERMINED mmol/L (ref 135–145)
TOTAL PROTEIN: UNDETERMINED g/dL (ref 6.5–8.1)

## 2015-10-08 LAB — LIPID PANEL
CHOLESTEROL: 335 mg/dL — AB (ref 0–200)
LDL Cholesterol: UNDETERMINED mg/dL (ref 0–99)
Triglycerides: 1940 mg/dL — ABNORMAL HIGH (ref <150)
VLDL: UNDETERMINED mg/dL (ref 0–40)

## 2015-10-08 LAB — URINE DRUG SCREEN, QUALITATIVE (ARMC ONLY)
Amphetamines, Ur Screen: NOT DETECTED
BARBITURATES, UR SCREEN: NOT DETECTED
BENZODIAZEPINE, UR SCRN: NOT DETECTED
CANNABINOID 50 NG, UR ~~LOC~~: NOT DETECTED
Cocaine Metabolite,Ur ~~LOC~~: NOT DETECTED
MDMA (Ecstasy)Ur Screen: NOT DETECTED
METHADONE SCREEN, URINE: NOT DETECTED
OPIATE, UR SCREEN: POSITIVE — AB
Phencyclidine (PCP) Ur S: NOT DETECTED
Tricyclic, Ur Screen: POSITIVE — AB

## 2015-10-08 LAB — CBC WITH DIFFERENTIAL/PLATELET
Basophils Absolute: 0.1 10*3/uL (ref 0–0.1)
EOS ABS: 0.9 10*3/uL — AB (ref 0–0.7)
Eosinophils Relative: 10 %
HEMATOCRIT: 46.8 % (ref 40.0–52.0)
HEMOGLOBIN: 16.5 g/dL (ref 13.0–18.0)
LYMPHS ABS: 2.5 10*3/uL (ref 1.0–3.6)
Lymphocytes Relative: 27 %
MCH: 42.2 pg — ABNORMAL HIGH (ref 26.0–34.0)
MCHC: 35.2 g/dL (ref 32.0–36.0)
MCV: 98.2 fL (ref 80.0–100.0)
Monocytes Absolute: 0.7 10*3/uL (ref 0.2–1.0)
NEUTROS ABS: 4.8 10*3/uL (ref 1.4–6.5)
Platelets: 293 10*3/uL (ref 150–440)
RBC: 3.95 MIL/uL — AB (ref 4.40–5.90)
RDW: 13.3 % (ref 11.5–14.5)
WBC: 9.1 10*3/uL (ref 3.8–10.6)

## 2015-10-08 LAB — TROPONIN I
Troponin I: <0.03 ng/mL (ref <0.031)
Troponin I: 0.38 ng/mL — ABNORMAL HIGH (ref <0.031)
Troponin I: 7.49 ng/mL — ABNORMAL HIGH (ref <0.031)
Troponin I: 18.01 ng/mL — ABNORMAL HIGH (ref <0.031)

## 2015-10-08 LAB — MRSA PCR SCREENING: MRSA BY PCR: NEGATIVE

## 2015-10-08 LAB — GLUCOSE, CAPILLARY: GLUCOSE-CAPILLARY: 108 mg/dL — AB (ref 65–99)

## 2015-10-08 LAB — APTT: aPTT: 47 seconds — ABNORMAL HIGH (ref 24–36)

## 2015-10-08 LAB — PROTIME-INR
INR: 1.78
Prothrombin Time: 20.7 seconds — ABNORMAL HIGH (ref 11.4–15.0)

## 2015-10-08 LAB — HEPARIN LEVEL (UNFRACTIONATED): Heparin Unfractionated: 0.21 IU/mL — ABNORMAL LOW (ref 0.30–0.70)

## 2015-10-08 MED ORDER — ONDANSETRON HCL 4 MG/2ML IJ SOLN
4.0000 mg | Freq: Once | INTRAMUSCULAR | Status: AC
  Administered 2015-10-08: 4 mg via INTRAVENOUS

## 2015-10-08 MED ORDER — MORPHINE SULFATE (PF) 4 MG/ML IV SOLN
INTRAVENOUS | Status: AC
  Administered 2015-10-08: 08:00:00
  Filled 2015-10-08: qty 1

## 2015-10-08 MED ORDER — IOPAMIDOL (ISOVUE-370) INJECTION 76%
100.0000 mL | Freq: Once | INTRAVENOUS | Status: AC | PRN
  Administered 2015-10-08: 100 mL via INTRAVENOUS

## 2015-10-08 MED ORDER — NITROGLYCERIN IN D5W 200-5 MCG/ML-% IV SOLN
0.0000 ug/min | INTRAVENOUS | Status: DC
  Administered 2015-10-08: 40 ug/min via INTRAVENOUS
  Filled 2015-10-08 (×2): qty 250

## 2015-10-08 MED ORDER — HYDROMORPHONE HCL 1 MG/ML IJ SOLN
2.0000 mg | Freq: Once | INTRAMUSCULAR | Status: AC
  Administered 2015-10-08: 2 mg via INTRAVENOUS
  Filled 2015-10-08: qty 2

## 2015-10-08 MED ORDER — PREGABALIN 75 MG PO CAPS: 100.0000 mg | ORAL_CAPSULE | Freq: Every day | ORAL | Status: DC

## 2015-10-08 MED ORDER — ATORVASTATIN CALCIUM 20 MG PO TABS: 80.0000 mg | ORAL_TABLET | Freq: Every evening | ORAL | Status: DC

## 2015-10-08 MED ORDER — NITROGLYCERIN 2 % TD OINT
1.0000 [in_us] | TOPICAL_OINTMENT | Freq: Once | TRANSDERMAL | Status: AC
  Administered 2015-10-08: 1 [in_us] via TOPICAL
  Filled 2015-10-08: qty 1

## 2015-10-08 MED ORDER — HEPARIN BOLUS VIA INFUSION
2000.0000 [IU] | Freq: Once | INTRAVENOUS | Status: DC
  Filled 2015-10-08: qty 2000

## 2015-10-08 MED ORDER — MORPHINE SULFATE (PF) 4 MG/ML IV SOLN
4.0000 mg | Freq: Once | INTRAVENOUS | Status: AC
  Administered 2015-10-08: 4 mg via INTRAVENOUS
  Filled 2015-10-08: qty 1

## 2015-10-08 MED ORDER — NIACIN 100 MG PO TABS
250.0000 mg | ORAL_TABLET | Freq: Every day | ORAL | Status: DC
  Filled 2015-10-08: qty 3

## 2015-10-08 MED ORDER — PREGABALIN 75 MG PO CAPS
200.0000 mg | ORAL_CAPSULE | Freq: Every day | ORAL | Status: DC
  Administered 2015-10-08: 200 mg via ORAL
  Filled 2015-10-08: qty 2

## 2015-10-08 MED ORDER — MORPHINE SULFATE (PF) 4 MG/ML IV SOLN
4.0000 mg | Freq: Once | INTRAVENOUS | Status: AC
  Administered 2015-10-08: 4 mg via INTRAVENOUS

## 2015-10-08 MED ORDER — OMEGA-3-ACID ETHYL ESTERS 1 G PO CAPS
1.0000 g | ORAL_CAPSULE | Freq: Two times a day (BID) | ORAL | Status: DC
  Administered 2015-10-08 (×2): 1 g via ORAL
  Filled 2015-10-08 (×2): qty 1

## 2015-10-08 MED ORDER — ONDANSETRON HCL 4 MG/2ML IJ SOLN
4.0000 mg | Freq: Four times a day (QID) | INTRAMUSCULAR | Status: DC | PRN
  Administered 2015-10-08: 4 mg via INTRAVENOUS
  Filled 2015-10-08: qty 2

## 2015-10-08 MED ORDER — HYDROMORPHONE HCL 1 MG/ML IJ SOLN
1.0000 mg | Freq: Once | INTRAMUSCULAR | Status: AC
  Administered 2015-10-08: 1 mg via INTRAVENOUS
  Filled 2015-10-08: qty 1

## 2015-10-08 MED ORDER — NITROGLYCERIN IN D5W 200-5 MCG/ML-% IV SOLN
0.0000 ug/min | Freq: Once | INTRAVENOUS | Status: AC
  Administered 2015-10-08: 5 ug/min via INTRAVENOUS
  Filled 2015-10-08: qty 250

## 2015-10-08 MED ORDER — HEPARIN BOLUS VIA INFUSION
1500.0000 [IU] | Freq: Once | INTRAVENOUS | Status: AC
  Administered 2015-10-08: 1500 [IU] via INTRAVENOUS
  Filled 2015-10-08: qty 1500

## 2015-10-08 MED ORDER — MORPHINE SULFATE (PF) 4 MG/ML IV SOLN
4.0000 mg | INTRAVENOUS | Status: DC | PRN
  Administered 2015-10-08 (×3): 4 mg via INTRAVENOUS
  Filled 2015-10-08 (×3): qty 1

## 2015-10-08 MED ORDER — SODIUM CHLORIDE 0.9 % IV SOLN: 250.0000 mL | INTRAVENOUS | Status: DC | PRN

## 2015-10-08 MED ORDER — ONDANSETRON HCL 4 MG/2ML IJ SOLN
INTRAMUSCULAR | Status: AC
  Administered 2015-10-08: 4 mg via INTRAVENOUS
  Filled 2015-10-08: qty 2

## 2015-10-08 MED ORDER — SIMETHICONE 80 MG PO CHEW
80.0000 mg | CHEWABLE_TABLET | Freq: Four times a day (QID) | ORAL | Status: DC | PRN
  Filled 2015-10-08: qty 1

## 2015-10-08 MED ORDER — SODIUM CHLORIDE 0.9% FLUSH
3.0000 mL | Freq: Two times a day (BID) | INTRAVENOUS | Status: DC
  Administered 2015-10-08 (×2): 3 mL via INTRAVENOUS

## 2015-10-08 MED ORDER — PREGABALIN 50 MG PO CAPS: 100.0000 mg | ORAL_CAPSULE | Freq: Two times a day (BID) | ORAL | Status: DC

## 2015-10-08 MED ORDER — WARFARIN SODIUM 4 MG PO TABS: 4.0000 mg | ORAL_TABLET | Freq: Every day | ORAL | Status: DC

## 2015-10-08 MED ORDER — METOPROLOL TARTRATE 25 MG PO TABS
25.0000 mg | ORAL_TABLET | Freq: Once | ORAL | Status: AC
  Administered 2015-10-08: 25 mg via ORAL
  Filled 2015-10-08: qty 1

## 2015-10-08 MED ORDER — HYDROMORPHONE HCL 1 MG/ML IJ SOLN
2.0000 mg | INTRAMUSCULAR | Status: DC | PRN
  Administered 2015-10-08 – 2015-10-09 (×3): 2 mg via INTRAVENOUS
  Filled 2015-10-08 (×3): qty 2

## 2015-10-08 MED ORDER — RANOLAZINE ER 500 MG PO TB12
500.0000 mg | ORAL_TABLET | Freq: Two times a day (BID) | ORAL | Status: DC
  Administered 2015-10-08 (×2): 500 mg via ORAL
  Filled 2015-10-08 (×3): qty 1

## 2015-10-08 MED ORDER — HEPARIN BOLUS VIA INFUSION
2000.0000 [IU] | Freq: Once | INTRAVENOUS | Status: AC
  Administered 2015-10-08: 2000 [IU] via INTRAVENOUS
  Filled 2015-10-08: qty 2000

## 2015-10-08 MED ORDER — HEPARIN (PORCINE) IN NACL 100-0.45 UNIT/ML-% IJ SOLN
1400.0000 [IU]/h | INTRAMUSCULAR | Status: DC
  Administered 2015-10-08: 1200 [IU]/h via INTRAVENOUS
  Filled 2015-10-08 (×3): qty 250

## 2015-10-08 MED ORDER — HEPARIN (PORCINE) IN NACL 100-0.45 UNIT/ML-% IJ SOLN
1200.0000 [IU]/h | INTRAMUSCULAR | Status: DC
  Filled 2015-10-08 (×2): qty 250

## 2015-10-08 MED ORDER — HYDROMORPHONE HCL 1 MG/ML IJ SOLN
1.0000 mg | INTRAMUSCULAR | Status: DC | PRN
  Administered 2015-10-08: 1 mg via INTRAVENOUS
  Filled 2015-10-08: qty 1

## 2015-10-08 MED ORDER — SODIUM CHLORIDE 0.9% FLUSH: 3.0000 mL | INTRAVENOUS | Status: DC | PRN

## 2015-10-08 MED ORDER — MORPHINE SULFATE (PF) 4 MG/ML IV SOLN
INTRAVENOUS | Status: AC
  Administered 2015-10-08: 4 mg via INTRAVENOUS
  Filled 2015-10-08: qty 1

## 2015-10-08 MED ORDER — PANTOPRAZOLE SODIUM 40 MG PO TBEC
40.0000 mg | DELAYED_RELEASE_TABLET | Freq: Every day | ORAL | Status: DC
  Administered 2015-10-08: 40 mg via ORAL
  Filled 2015-10-08: qty 1

## 2015-10-08 MED ORDER — SENNOSIDES-DOCUSATE SODIUM 8.6-50 MG PO TABS
2.0000 | ORAL_TABLET | Freq: Two times a day (BID) | ORAL | Status: DC
  Administered 2015-10-08 (×2): 2 via ORAL
  Filled 2015-10-08 (×2): qty 2

## 2015-10-08 MED ORDER — NORTRIPTYLINE HCL 10 MG PO CAPS
20.0000 mg | ORAL_CAPSULE | Freq: Every day | ORAL | Status: DC
  Administered 2015-10-08: 20 mg via ORAL
  Filled 2015-10-08: qty 2

## 2015-10-08 MED ORDER — ISOSORBIDE MONONITRATE ER 30 MG PO TB24: 30.0000 mg | ORAL_TABLET | Freq: Every day | ORAL | Status: DC

## 2015-10-08 MED ORDER — METOPROLOL SUCCINATE ER 50 MG PO TB24
50.0000 mg | ORAL_TABLET | Freq: Every day | ORAL | Status: DC
  Administered 2015-10-08: 50 mg via ORAL
  Filled 2015-10-08 (×2): qty 1

## 2015-10-08 MED ORDER — WARFARIN - PHYSICIAN DOSING INPATIENT: Freq: Every day | Status: DC

## 2015-10-08 MED ORDER — OXYCODONE HCL 5 MG PO TABS
5.0000 mg | ORAL_TABLET | Freq: Every day | ORAL | Status: DC
  Administered 2015-10-08 (×4): 5 mg via ORAL
  Filled 2015-10-08 (×4): qty 1

## 2015-10-08 MED ORDER — FENOFIBRATE 54 MG PO TABS
54.0000 mg | ORAL_TABLET | Freq: Every day | ORAL | Status: DC
  Administered 2015-10-08: 54 mg via ORAL
  Filled 2015-10-08: qty 1

## 2015-10-08 MED ORDER — FENOFIBRATE 160 MG PO TABS
160.0000 mg | ORAL_TABLET | Freq: Every day | ORAL | Status: DC
  Administered 2015-10-08: 160 mg via ORAL
  Filled 2015-10-08: qty 1

## 2015-10-08 NOTE — Progress Notes (Addendum)
ANTICOAGULATION CONSULT NOTE - Initial Consult  Pharmacy Consult for heparin Indication: chest pain/ACS  No Known Allergies  Patient Measurements: Height: 6' (182.9 cm) Weight: 237 lb 6 oz (107.673 kg) IBW/kg (Calculated) : 77.6 Heparin Dosing Weight: 100.2 kg  Vital Signs: Temp: 98.1 F (36.7 C) (06/21 0317) Temp Source: Oral (06/21 0317) BP: 132/76 mmHg (06/21 0515) Pulse Rate: 80 (06/21 0515)  Labs:  Recent Labs  10/08/15 0245  HGB 16.5  HCT 46.8  PLT 293  APTT 47*  LABPROT 20.7*  INR 1.78  CREATININE 1.43*  TROPONINI <0.03    Estimated Creatinine Clearance: 68.7 mL/min (by C-G formula based on Cr of 1.43).   Medical History: Past Medical History  Diagnosis Date  . Heart attack (HCC)   . Hypertension     Medications:  Infusions:  . heparin      Assessment: 59 yom cc CP. Is on LMHW bridge to [facility]. INR 1.78, aPTT 47, HL pending. Pharmacy consulted to dose heparin for ACS.   Goal of Therapy:  Heparin level 0.3-0.7 units/ml Monitor platelets by anticoagulation protocol: Yes   Plan:  Give 2000 units bolus x 1 Start heparin infusion at 1200 units/hr Check anti-Xa level in 6 hours and daily while on heparin Continue to monitor H&H and platelets  Carola FrostNathan A Chaunce Winkels, Pharm.D., BCPS Clinical Pharmacist 10/08/2015,5:52 AM

## 2015-10-08 NOTE — Progress Notes (Signed)
[facility] [place] [facility]   PATIENT NAME: Dylan Whitaker    MRN#:  [id number]  DATE OF BIRTH:  06-Oct-1955  SUBJECTIVE:  Hospital Day: 0 days Egon Dittus is a 60 y.o. male presenting with Chest Pain .   Overnight events: No overnight events, remains on nitroglycerin drip Interval Events: Continued complaints of chest pain this morning worsened from earlier rate 9/10  REVIEW OF SYSTEMS:  CONSTITUTIONAL: No fever, fatigue or weakness.  EYES: No blurred or double vision.  EARS, NOSE, AND THROAT: No tinnitus or ear pain.  RESPIRATORY: No cough, shortness of breath, wheezing or hemoptysis.  CARDIOVASCULAR: Positive chest pain, denies orthopnea, edema.  GASTROINTESTINAL: No nausea, vomiting, diarrhea or abdominal pain.  GENITOURINARY: No dysuria, hematuria.  ENDOCRINE: No polyuria, nocturia,  HEMATOLOGY: No anemia, easy bruising or bleeding SKIN: No rash or lesion. MUSCULOSKELETAL: No joint pain or arthritis.   NEUROLOGIC: No tingling, numbness, weakness.  PSYCHIATRY: No anxiety or depression.   DRUG ALLERGIES:  No Known Allergies  VITALS:  Blood pressure 119/69, pulse 74, temperature 97.7 F (36.5 C), temperature source Oral, resp. rate 22, height 6' (1.829 m), weight 228 lb 4.8 oz (103.556 kg), SpO2 95 %.  PHYSICAL EXAMINATION:  VITAL SIGNS: Filed Vitals:   10/08/15 1112 10/08/15 1211  BP: 112/60 119/69  Pulse: 73 74  Temp: 97.7 F (36.5 C)   Resp: 18 22   GENERAL:59 y.o.male currently in no acute distress.  HEAD: Normocephalic, atraumatic.  EYES: Pupils equal, round, reactive to light. Extraocular muscles intact. No scleral icterus.  MOUTH: Moist mucosal membrane. Dentition intact. No abscess noted.  EAR, NOSE, THROAT: Clear without exudates. No external lesions.  NECK: Supple. No thyromegaly. No nodules. No JVD.  PULMONARY: Clear to ascultation, without wheeze rails or rhonci. No use of accessory muscles, Good respiratory effort. good  air entry bilaterally CHEST: Nontender to palpation.  CARDIOVASCULAR: S1 and S2. Regular rate and rhythm. No murmurs, rubs, or gallops. No edema. Pedal pulses 2+ bilaterally.  GASTROINTESTINAL: Soft, nontender, nondistended. No masses. Positive bowel sounds. No hepatosplenomegaly.  MUSCULOSKELETAL: No swelling, clubbing, or edema. Range of motion full in all extremities.  NEUROLOGIC: Cranial nerves II through XII are intact. No gross focal neurological deficits. Sensation intact. Reflexes intact.  SKIN: No ulceration, lesions, rashes, or cyanosis. Skin warm and dry. Turgor intact.  PSYCHIATRIC: Mood, affect within normal limits. The patient is awake, alert and oriented x 3. Insight, judgment intact.      LABORATORY PANEL:   CBC  Recent Labs Lab 10/08/15 0245  WBC 9.1  HGB 16.5  HCT 46.8  PLT 293   ------------------------------------------------------------------------------------------------------------------  Chemistries   Recent Labs Lab 10/08/15 0245  NA UNABLE TO REPORT DUE TO LIPEMIC INTERFERENCE  K UNABLE TO REPORT DUE TO LIPEMIC INTERFERENCE  CL 101  CO2 UNABLE TO REPORT DUE TO LIPEMIC INTERFERENCE  GLUCOSE UNABLE TO REPORT DUE TO LIPEMIC INTERFERENCE  BUN 19  CREATININE 1.43*  CALCIUM UNABLE TO REPORT DUE TO LIPEMIC INTERFERENCE  AST 34  ALT UNABLE TO REPORT DUE TO LIPEMIC INTERFERENCE  ALKPHOS 54  BILITOT 0.6   ------------------------------------------------------------------------------------------------------------------  Cardiac Enzymes  Recent Labs Lab 10/08/15 1237  TROPONINI 7.49*   ------------------------------------------------------------------------------------------------------------------  RADIOLOGY:  Dg Chest Portable 1 View  10/08/2015  CLINICAL DATA:  Acute onset of left-sided chest pain. Initial encounter. EXAM: PORTABLE CHEST 1 VIEW COMPARISON:  Chest radiograph from 02/13/2014 FINDINGS: The lungs are well-aerated and clear. There  is no evidence of focal opacification,  pleural effusion or pneumothorax. The cardiomediastinal silhouette is normal in size. The patient is status post median sternotomy. No acute osseous abnormalities are seen. IMPRESSION: No acute cardiopulmonary process seen. Electronically Signed   By: Roanna RaiderJeffery  Chang M.D.   On: 10/08/2015 03:20   Ct Angio Chest Aorta W/cm &/or Wo/cm  10/08/2015  CLINICAL DATA:  Chest pain beginning 30 minutes prior to arrival. History of hypertension and myocardial infarction. EXAM: CT ANGIOGRAPHY CHEST WITH CONTRAST TECHNIQUE: Multidetector CT imaging of the chest was performed using the standard protocol during bolus administration of intravenous contrast. Multiplanar CT image reconstructions and MIPs were obtained to evaluate the vascular anatomy. CONTRAST:  100 cc Isovue 370 COMPARISON:  Chest radiograph October 08, 2015 at 0301 hours FINDINGS: MEDIASTINUM: No abnormal intrinsic density along the course of the thoracic aorta on noncontrast imaging. Thoracic aorta is normal course and caliber with moderate intimal thickening and mild calcific atherosclerosis. No dissection, aneurysm, contrast extravasation or periaortic fluid collections. Eccentric intimal thickening calcific atherosclerosis without flow limiting stenosis LEFT subclavian artery origin. The heart is mildly enlarged. Severe coronary artery calcifications with this multiple coronary artery stents, status post median sternotomy. No pericardial fluid collections. Heart and pericardium are unremarkable. Thoracic aorta is normal course and caliber, unremarkable. No lymphadenopathy by CT size criteria. Small calcified hilar and mediastinal lymph nodes bilaterally. LUNGS: Tracheobronchial tree is patent, no pneumothorax. No pleural effusions, focal consolidations, pulmonary nodules or masses. Scarring RIGHT lung apex. Scattered punctate granulomas. Mild centrilobular and paraseptal emphysema. SOFT TISSUES AND OSSEOUS STRUCTURES:  Included view of the abdomen is unremarkable. Visualized soft tissues and included osseous structures appear nonacute, multilevel Schmorl's nodes. Review of the MIP images confirms the above findings. IMPRESSION: No acute vascular process or acute cardiopulmonary process. Status post CABG, multiple coronary artery stents and severe coronary artery calcifications. Mild cardiomegaly. Electronically Signed   By: Awilda Metroourtnay  Bloomer M.D.   On: 10/08/2015 05:12    EKG:   Orders placed or performed in visit on 10/08/15  . EKG 12-Lead    ASSESSMENT AND PLAN:   Stacie GlazeGary Sowle is a 60 y.o. male presenting with Chest Pain . Admitted 10/08/2015 : Day #: 0 days 1. NSTEMI: Cardiology input appreciated continue with heparin and nitroglycerin drips, pain medication as required given increased pain and recheck troponin 2. Hyperlipidemia unspecified: Blood results this morning states blood to lipemic-Check lipid panel, I would suspect lipids may be in the thousand plus range. Continue with high intensity statin and fenofibrate - likely add omega 3 and niacin if level actually that high 3. gerd without esophagitis: ppi  All the records are reviewed and case discussed with Care Management/Social Workerr. Management plans discussed with the patient, family and they are in agreement.  CODE STATUS: full TOTAL TIME TAKING CARE OF THIS PATIENT: 33 minutes.   POSSIBLE D/C IN 2-3DAYS, DEPENDING ON CLINICAL CONDITION.   Lysette Lindenbaum,  Mardi MainlandDavid K M.D on 10/08/2015 at 2:07 PM  Between 7am to 6pm - Pager - [phone]  After 6pm: House Pager: [phone]  [facility]  Office  [phone]  CC: Primary care physician; No primary care provider on file.

## 2015-10-08 NOTE — ED Notes (Signed)
Pt transferred on to mobile CT stretcher, taken out to mobile CT because hospital CT is down at current moment. Pt under care of this RN, Pam and Aundra MilletMegan, CT techs.

## 2015-10-08 NOTE — Discharge Summary (Signed)
[employer] [facility] [place] [facility]   PATIENT NAME: Dylan Whitaker    MR#:  [id number]  DATE OF BIRTH:  Mar 13, 1956  DATE OF ADMISSION:  10/08/2015 ADMITTING PHYSICIAN: Gracelyn Nurse, MD  DATE OF DISCHARGE: 10/08/2015  PRIMARY CARE PHYSICIAN: No primary care provider on file.    ADMISSION DIAGNOSIS:  Chest pain, unspecified chest pain type [R07.9]  DISCHARGE DIAGNOSIS:  Principal Problem:   NSTEMI (non-ST elevated myocardial infarction) (HCC)   Hypertriglyceridemia   CAD in native artery   History of substance abuse   Ischemic cardiomyopathy   Essential hypertension   HLD (hyperlipidemia)   PAD (peripheral artery disease) (HCC)   CKD (chronic kidney disease), stage II   SECONDARY DIAGNOSIS:   Past Medical History  Diagnosis Date  . CAD (coronary artery disease)     a. s/p multiple MIs; b. S/p CABG; b. s/p multiple PCIs  . Essential hypertension   . Ischemic cardiomyopathy   . Polysubstance abuse   . HLD (hyperlipidemia)   . CKD (chronic kidney disease), stage II   . PAD (peripheral artery disease) (HCC)     a. s/p grafting and stenting  . Stroke [facility])     a. in the setting of RLE graft repair    HOSPITAL COURSE:  Diem Pagnotta  is a 60 y.o. male admitted 10/08/2015 with chief complaint Chest Pain . Please see H&P performed by Gracelyn Nurse, MD for further information. Patient presented with the above symptoms. Found to have elevated troponin on admission subsequently placed on heparin drip-therapeutic, as well as nitroglycerin drip for symptom control. Despite this he did require multiple doses of IV narcotics for relief. Throughout his hospitalization his symptoms actually worsened despite the above measures repeat troponin came back at 7 up from 0.3. Interestingly enough his blood work was unable to be performed secondary to elevated lipids. I checked a lipid panel was morning triglycerides 1900.  He was evaluated by cardiology during his stay in  the hospital who felt he would benefit transfer to tertiary care center [facility]) as this is where the majority of his care is taken place. His case is somewhat complicated Given recent stroke, multiple prior coronary interventions including numerous stents and bypass surgery. The patient is agreeable to this plan we'll continue nitroglycerin and heparin in the meantime awaiting transfer.  DISCHARGE CONDITIONS:   Fair, transferred to [facility],  CONSULTS OBTAINED:  Treatment Team:  Marykay Lex, MD  DRUG ALLERGIES:  No Known Allergies  DISCHARGE MEDICATIONS:   Current Discharge Medication List    CONTINUE these medications which have NOT CHANGED   Details  atorvastatin (LIPITOR) 80 MG tablet Take 80 mg by mouth every evening.    fenofibrate (TRICOR) 48 MG tablet Take 48 mg by mouth daily.    isosorbide mononitrate (IMDUR) 30 MG 24 hr tablet Take 30 mg by mouth daily.    LYRICA 100 MG capsule Take 200 mg by mouth at bedtime. 100mg  in the morning and 200mg  at bedtime    metoprolol succinate (TOPROL-XL) 50 MG 24 hr tablet Take 50 mg by mouth daily.    nitroGLYCERIN (NITROSTAT) 0.4 MG SL tablet Place 0.4 mg under the tongue every 5 (five) minutes as needed for chest pain.    nortriptyline (PAMELOR) 10 MG capsule Take 20 mg by mouth at bedtime.    oxyCODONE (OXY IR/ROXICODONE) 5 MG immediate release tablet Take 5 mg by mouth 5 (five) times daily.    pantoprazole (PROTONIX)  40 MG tablet Take 40 mg by mouth daily.    ranolazine (RANEXA) 500 MG 12 hr tablet Take 500 mg by mouth 2 (two) times daily.    senna-docusate (SENOKOT-S) 8.6-50 MG tablet Take 2 tablets by mouth 2 (two) times daily.    simethicone (MYLICON) 80 MG chewable tablet Chew 80 mg by mouth 4 (four) times daily as needed for flatulence.    warfarin (COUMADIN) 4 MG tablet Take 4 mg by mouth at bedtime.    enoxaparin (LOVENOX) 100 MG/ML injection Inject 90 mg into the skin every 12 (twelve) hours. [place]          DISCHARGE INSTRUCTIONS:    DIET:  Cardiac diet  DISCHARGE CONDITION:  Fair  ACTIVITY:  Activity as tolerated  OXYGEN:  Home Oxygen: No.   Oxygen Delivery: room air  DISCHARGE LOCATION:  [facility]  If you experience worsening of your admission symptoms, develop shortness of breath, life threatening emergency, suicidal or homicidal thoughts you must seek medical attention immediately by calling 911 or calling your MD immediately  if symptoms less severe.  You Must read complete instructions/literature along with all the possible adverse reactions/side effects for all the Medicines you take and that have been prescribed to you. Take any new Medicines after you have completely understood and accpet all the possible adverse reactions/side effects.   Please note  You were cared for by a hospitalist during your hospital stay. If you have any questions about your discharge medications or the care you received while you were in the hospital after you are discharged, you can call the unit and asked to speak with the hospitalist on call if the hospitalist that took care of you is not available. Once you are discharged, your primary care physician will handle any further medical issues. Please note that NO REFILLS for any discharge medications will be authorized once you are discharged, as it is imperative that you return to your primary care physician (or establish a relationship with a primary care physician if you do not have one) for your aftercare needs so that they can reassess your need for medications and monitor your lab values.    On the day of Discharge:   VITAL SIGNS:  Blood pressure 119/69, pulse 74, temperature 97.7 F (36.5 C), temperature source Oral, resp. rate 22, height 6' (1.829 m), weight 228 lb 4.8 oz (103.556 kg), SpO2 95 %.  I/O:   Intake/Output Summary (Last 24 hours) at 10/08/15 1454 Last data filed at 10/08/15 1412  Gross per 24 hour  Intake      0 ml   Output    910 ml  Net   -910 ml    PHYSICAL EXAMINATION:  GENERAL:  60 y.o.-year-old patient lying in the bed withMild/moderate distress given pain  EYES: Pupils equal, round, reactive to light and accommodation. No scleral icterus. Extraocular muscles intact.  HEENT: Head atraumatic, normocephalic. Oropharynx and nasopharynx clear.  NECK:  Supple, no jugular venous distention. No thyroid enlargement, no tenderness.  LUNGS: Normal breath sounds bilaterally, no wheezing, rales,rhonchi or crepitation. No use of accessory muscles of respiration.  CARDIOVASCULAR: S1, S2 normal. No murmurs, rubs, or gallops.  ABDOMEN: Soft, non-tender, non-distended. Bowel sounds present. No organomegaly or mass.  EXTREMITIES: No pedal edema, cyanosis, or clubbing.  NEUROLOGIC: Cranial nerves II through XII are intact. Muscle strength 5/5 in all extremities. Sensation intact. Gait not checked.  PSYCHIATRIC: The patient is alert and oriented x 3.  SKIN: No obvious rash, lesion,  or ulcer.   DATA REVIEW:   CBC  Recent Labs Lab 10/08/15 0245  WBC 9.1  HGB 16.5  HCT 46.8  PLT 293    Chemistries   Recent Labs Lab 10/08/15 0245  NA UNABLE TO REPORT DUE TO LIPEMIC INTERFERENCE  K UNABLE TO REPORT DUE TO LIPEMIC INTERFERENCE  CL 101  CO2 UNABLE TO REPORT DUE TO LIPEMIC INTERFERENCE  GLUCOSE UNABLE TO REPORT DUE TO LIPEMIC INTERFERENCE  BUN 19  CREATININE 1.43*  CALCIUM UNABLE TO REPORT DUE TO LIPEMIC INTERFERENCE  AST 34  ALT UNABLE TO REPORT DUE TO LIPEMIC INTERFERENCE  ALKPHOS 54  BILITOT 0.6    Cardiac Enzymes  Recent Labs Lab 10/08/15 1237  TROPONINI 7.49*    Microbiology Results  No results found for this or any previous visit.  RADIOLOGY:  Dg Chest Portable 1 View  10/08/2015  CLINICAL DATA:  Acute onset of left-sided chest pain. Initial encounter. EXAM: PORTABLE CHEST 1 VIEW COMPARISON:  Chest radiograph from 02/13/2014 FINDINGS: The lungs are well-aerated and clear. There is  no evidence of focal opacification, pleural effusion or pneumothorax. The cardiomediastinal silhouette is normal in size. The patient is status post median sternotomy. No acute osseous abnormalities are seen. IMPRESSION: No acute cardiopulmonary process seen. Electronically Signed   By: Roanna Raider M.D.   On: 10/08/2015 03:20   Ct Angio Chest Aorta W/cm &/or Wo/cm  10/08/2015  CLINICAL DATA:  Chest pain beginning 30 minutes prior to arrival. History of hypertension and myocardial infarction. EXAM: CT ANGIOGRAPHY CHEST WITH CONTRAST TECHNIQUE: Multidetector CT imaging of the chest was performed using the standard protocol during bolus administration of intravenous contrast. Multiplanar CT image reconstructions and MIPs were obtained to evaluate the vascular anatomy. CONTRAST:  100 cc Isovue 370 COMPARISON:  Chest radiograph October 08, 2015 at 0301 hours FINDINGS: MEDIASTINUM: No abnormal intrinsic density along the course of the thoracic aorta on noncontrast imaging. Thoracic aorta is normal course and caliber with moderate intimal thickening and mild calcific atherosclerosis. No dissection, aneurysm, contrast extravasation or periaortic fluid collections. Eccentric intimal thickening calcific atherosclerosis without flow limiting stenosis LEFT subclavian artery origin. The heart is mildly enlarged. Severe coronary artery calcifications with this multiple coronary artery stents, status post median sternotomy. No pericardial fluid collections. Heart and pericardium are unremarkable. Thoracic aorta is normal course and caliber, unremarkable. No lymphadenopathy by CT size criteria. Small calcified hilar and mediastinal lymph nodes bilaterally. LUNGS: Tracheobronchial tree is patent, no pneumothorax. No pleural effusions, focal consolidations, pulmonary nodules or masses. Scarring RIGHT lung apex. Scattered punctate granulomas. Mild centrilobular and paraseptal emphysema. SOFT TISSUES AND OSSEOUS STRUCTURES: Included  view of the abdomen is unremarkable. Visualized soft tissues and included osseous structures appear nonacute, multilevel Schmorl's nodes. Review of the MIP images confirms the above findings. IMPRESSION: No acute vascular process or acute cardiopulmonary process. Status post CABG, multiple coronary artery stents and severe coronary artery calcifications. Mild cardiomegaly. Electronically Signed   By: Awilda Metro M.D.   On: 10/08/2015 05:12     Management plans discussed with the patient, family and they are in agreement.  CODE STATUS:     Code Status Orders        Start     Ordered   10/08/15 0703  Full code   Continuous     10/08/15 0702    Code Status History    Date Active Date Inactive Code Status Order ID Comments User Context   This patient has a current code status  but no historical code status.      TOTAL TIME TAKING CARE OF THIS PATIENT: 45 minutes.    Hower,  Mardi MainlandDavid K M.D on 10/08/2015 at 2:54 PM  Between 7am to 6pm - Pager - [phone]  After 6pm go to [email or web address] - Scientist, research (life sciences)password EPAS [facility]  [facility] [employer] [place] [facility]  Office  [phone]  CC: Primary care physician; No primary care provider on file.

## 2015-10-08 NOTE — ED Notes (Signed)
Pt provided a sip of water, ok per Dr. Inocencio HomesGayle.

## 2015-10-08 NOTE — ED Notes (Signed)
Pt returned from CT and transferred back on to ED stretcher.

## 2015-10-08 NOTE — Care Management (Signed)
Present with chest pain and troponin is elevated and up to 7.49.  He has very extensive cardiovascular disease with many many (?30 is reported) stents.  He is currently employed- [place] in [place] during the [place] and returns to his home in [place] on the weekends.  Independent in all adls, denies issues accessing medical care, obtaining medications or with transportation.  [facility].  No discharge needs identified at present by care manager or members of care team.  Currently on nitroglycerin drip.  Reports recent CVA and hospitalization at [facility]. Cardiology is reluctant to cath patient with so recent a history of CVA.

## 2015-10-08 NOTE — Progress Notes (Signed)
   [facility] will accept the patient. Signed out to CCU fellow at 4:30 PM. [facility] is working on bed placement at this time and they will call nursing to once patient is ready for transfer on their end. They will contact [facility] transfer.   [facility] ICU nurse notified.

## 2015-10-08 NOTE — Progress Notes (Signed)
Report called to CCU nurse Brooke. Will get transport now.

## 2015-10-08 NOTE — Progress Notes (Signed)
Troponin has jumped to 7.49. Dylan Whitaker paged with cardiology. Currently consulting with Dr. Herbie BaltimoreHarding. Cardiology states to call Dr. Clint GuyHower and consult about sending patient to CCU. Cardiology still not planning on intervention at this time due to patient's high risk. Will continue narcotics for pain control. Dr. Clint GuyHower paged and will put in transfer order. Patient will go to unit for closer monitoring.

## 2015-10-08 NOTE — Progress Notes (Signed)
Active chest pain, elevating troponin, cardiology aware  No intervention planned at this time but would like patient transferred to icu - orders placed

## 2015-10-08 NOTE — ED Notes (Signed)
Pt stating this pain feels different from previous cardiac episodes, referring to heart attack a few years ago. Pt still holding chest and now stating mid chest pain and R arm pain, NOT L arm pain. Pt stating pain is constant and sharp. Pt remains to be moaning, no sweat visible, no emesis in bag.

## 2015-10-08 NOTE — H&P (Signed)
Dylan Whitaker is an 60 y.o. male.   Chief Complaint: Chest pain HPI: Sudden onset chest pain earlier today. Midsternal radiating to right arm and jaw. Currently on nitro drip but says pain is coming back. Resently hospitalized at [facility] for stenting of right femeral artey. Suffered CVA during hospital stay. Has history of unstable angina.  Past Medical History  Diagnosis Date  . Heart attack ([place])   . Hypertension    PVD CVAHTN  Past Surgical History  Procedure Laterality Date  . Coronary artery bypass graft      No family history on file.  CAD Social History:  has no tobacco, alcohol, and drug history on file.                           + tob Allergies: No Known Allergies   (Not in a hospital admission)  Results for orders placed or performed during the hospital encounter of 10/08/15 (from the past 48 hour(s))  CBC with Differential     Status: Abnormal   Collection Time: 10/08/15  2:45 AM  Result Value Ref Range   WBC 9.1 3.8 - 10.6 K/uL   RBC 3.95 (L) 4.40 - 5.90 MIL/uL   Hemoglobin 16.5 13.0 - 18.0 g/dL    Comment: PLASMA REPLACEMENT TECHNIQUE PERFORMED   HCT 46.8 40.0 - 52.0 %   MCV 98.2 80.0 - 100.0 fL   MCH 42.2 (H) 26.0 - 34.0 pg   MCHC 35.2 32.0 - 36.0 g/dL   RDW 13.3 11.5 - 14.5 %   Platelets 293 150 - 440 K/uL   Neutrophils Relative % 54% %   Neutro Abs 4.8 1.4 - 6.5 K/uL   Lymphocytes Relative 27% %   Lymphs Abs 2.5 1.0 - 3.6 K/uL   Monocytes Relative 8% %   Monocytes Absolute 0.7 0.2 - 1.0 K/uL   Eosinophils Relative 10% %   Eosinophils Absolute 0.9 (H) 0 - 0.7 K/uL   Basophils Relative 1% %   Basophils Absolute 0.1 0 - 0.1 K/uL  Comprehensive metabolic panel     Status: Abnormal   Collection Time: 10/08/15  2:45 AM  Result Value Ref Range   Sodium UNABLE TO REPORT DUE TO LIPEMIC INTERFERENCE 135 - 145 mmol/L   Potassium UNABLE TO REPORT DUE TO LIPEMIC INTERFERENCE 3.5 - 5.1 mmol/L   Chloride 101 101 - 111 mmol/L   CO2 UNABLE TO REPORT DUE TO LIPEMIC  INTERFERENCE 22 - 32 mmol/L   Glucose, Bld UNABLE TO REPORT DUE TO LIPEMIC INTERFERENCE 65 - 99 mg/dL   BUN 19 6 - 20 mg/dL   Creatinine, Ser 1.43 (H) 0.61 - 1.24 mg/dL   Calcium UNABLE TO REPORT DUE TO LIPEMIC INTERFERENCE 8.9 - 10.3 mg/dL   Total Protein UNABLE TO REPORT DUE TO LIPEMIC INTERFERENCE 6.5 - 8.1 g/dL   Albumin UNABLE TO REPORT DUE TO LIPEMIC INTERFERENCE 3.5 - 5.0 g/dL   AST 34 15 - 41 U/L   ALT UNABLE TO REPORT DUE TO LIPEMIC INTERFERENCE 17 - 63 U/L    Comment: NOT VALID   Alkaline Phosphatase 54 38 - 126 U/L   Total Bilirubin 0.6 0.3 - 1.2 mg/dL    Comment: LIPEMIC SPECIMEN, RESULTS MAY BE AFFECTED.   GFR calc non Af Amer 51 (L) >60 mL/min   GFR calc Af Amer 60 (L) >60 mL/min    Comment: (NOTE) The eGFR has been calculated using the CKD EPI equation. This calculation  has not been validated in all clinical situations. eGFR's persistently <60 mL/min signify possible Chronic Kidney Disease.    Anion gap UNABLE TO REPORT DUE TO LIPEMIC INTERFERENCE 5 - 15  Troponin I     Status: None   Collection Time: 10/08/15  2:45 AM  Result Value Ref Range   Troponin I <0.03 <0.031 ng/mL    Comment:        NO INDICATION OF MYOCARDIAL INJURY.   Protime-INR     Status: Abnormal   Collection Time: 10/08/15  2:45 AM  Result Value Ref Range   Prothrombin Time 20.7 (H) 11.4 - 15.0 seconds   INR 1.78   APTT     Status: Abnormal   Collection Time: 10/08/15  2:45 AM  Result Value Ref Range   aPTT 47 (H) 24 - 36 seconds    Comment:        IF BASELINE aPTT IS ELEVATED, SUGGEST PATIENT RISK ASSESSMENT BE USED TO DETERMINE APPROPRIATE ANTICOAGULANT THERAPY.    Dg Chest Portable 1 View  10/08/2015  CLINICAL DATA:  Acute onset of left-sided chest pain. Initial encounter. EXAM: PORTABLE CHEST 1 VIEW COMPARISON:  Chest radiograph from 02/13/2014 FINDINGS: The lungs are well-aerated and clear. There is no evidence of focal opacification, pleural effusion or pneumothorax. The  cardiomediastinal silhouette is normal in size. The patient is status post median sternotomy. No acute osseous abnormalities are seen. IMPRESSION: No acute cardiopulmonary process seen. Electronically Signed   By: Garald Balding M.D.   On: 10/08/2015 03:20   Ct Angio Chest Aorta W/cm &/or Wo/cm  10/08/2015  CLINICAL DATA:  Chest pain beginning 30 minutes prior to arrival. History of hypertension and myocardial infarction. EXAM: CT ANGIOGRAPHY CHEST WITH CONTRAST TECHNIQUE: Multidetector CT imaging of the chest was performed using the standard protocol during bolus administration of intravenous contrast. Multiplanar CT image reconstructions and MIPs were obtained to evaluate the vascular anatomy. CONTRAST:  100 cc Isovue 370 COMPARISON:  Chest radiograph October 08, 2015 at 0301 hours FINDINGS: MEDIASTINUM: No abnormal intrinsic density along the course of the thoracic aorta on noncontrast imaging. Thoracic aorta is normal course and caliber with moderate intimal thickening and mild calcific atherosclerosis. No dissection, aneurysm, contrast extravasation or periaortic fluid collections. Eccentric intimal thickening calcific atherosclerosis without flow limiting stenosis LEFT subclavian artery origin. The heart is mildly enlarged. Severe coronary artery calcifications with this multiple coronary artery stents, status post median sternotomy. No pericardial fluid collections. Heart and pericardium are unremarkable. Thoracic aorta is normal course and caliber, unremarkable. No lymphadenopathy by CT size criteria. Small calcified hilar and mediastinal lymph nodes bilaterally. LUNGS: Tracheobronchial tree is patent, no pneumothorax. No pleural effusions, focal consolidations, pulmonary nodules or masses. Scarring RIGHT lung apex. Scattered punctate granulomas. Mild centrilobular and paraseptal emphysema. SOFT TISSUES AND OSSEOUS STRUCTURES: Included view of the abdomen is unremarkable. Visualized soft tissues and included  osseous structures appear nonacute, multilevel Schmorl's nodes. Review of the MIP images confirms the above findings. IMPRESSION: No acute vascular process or acute cardiopulmonary process. Status post CABG, multiple coronary artery stents and severe coronary artery calcifications. Mild cardiomegaly. Electronically Signed   By: Elon Alas M.D.   On: 10/08/2015 05:12    Review of Systems  Constitutional: Negative for fever and chills.  HENT: Negative for hearing loss.   Eyes: Negative for blurred vision.  Respiratory: Negative for cough and shortness of breath.   Cardiovascular: Positive for chest pain.  Gastrointestinal: Negative for nausea and vomiting.  Genitourinary: Negative  for dysuria.  Musculoskeletal: Negative for back pain.  Skin: Negative for rash.  Neurological: Negative for focal weakness.  Psychiatric/Behavioral: Negative for depression.    Blood pressure 121/64, pulse 83, temperature 98.1 F (36.7 C), temperature source Oral, resp. rate 24, height 6' (1.829 m), weight 107.673 kg (237 lb 6 oz), SpO2 95 %. Physical Exam  Constitutional: He is oriented to person, place, and time. He appears well-developed and well-nourished.  HENT:  Head: Normocephalic and atraumatic.  Mouth/Throat: Oropharynx is clear and moist. No oropharyngeal exudate.  Eyes: EOM are normal. Pupils are equal, round, and reactive to light. No scleral icterus.  Neck: Normal range of motion. Neck supple. No JVD present. No tracheal deviation present. No thyromegaly present.  Cardiovascular: Normal rate.   No murmur heard. Respiratory: Effort normal. No respiratory distress. He exhibits no tenderness.  GI: Soft. Bowel sounds are normal. He exhibits no mass.  Musculoskeletal: He exhibits no edema or tenderness.  Lymphadenopathy:    He has no cervical adenopathy.  Neurological: He is alert and oriented to person, place, and time. No cranial nerve deficit.  Skin: Skin is warm and dry. No rash noted.      Assessment/Plan 1. Unstable Angina: Will start heparin drip and nitro drip. IV morphine. On B-blocker and ASA. First trop normal. Will cycle trop and consult cardiology.  2. CAD: Continue current meds.  3. PVD: S/P stent and bypass right leg.  4. Recent CVA: On anticoagulation but sub therapeutic. Now on IV heparin.  5. HTN: Continue current meds.  Time spent=88mn  JBaxter Hire MD 10/08/2015, 6:01 AM

## 2015-10-08 NOTE — ED Notes (Signed)
X-ray at bedside

## 2015-10-08 NOTE — Progress Notes (Signed)
Per Dr. Herbie BaltimoreHarding, patient will not go to the cath lab today. Patient's nitro drip is currently going at 2350mcg/hr. MD stated to leave the patient there and not titrate to 60 and leave the patient on it for today. Will titrate down some if patient's headache gets worse, slightly bothering him now. MD stated to give a one time dose of dilaudid for pain.

## 2015-10-08 NOTE — ED Notes (Signed)
Nitro patch removed from L chest.

## 2015-10-08 NOTE — Consult Note (Signed)
Cardiology Consultation Note  Patient ID: Dylan Whitaker, MRN: [id number], DOB/AGE: Jan 03, 1956 60 y.o. Admit date: 10/08/2015   Date of Consult: 10/08/2015 Primary Physician: No primary care provider on file. Primary Cardiologist: [facility] Requesting Physician: Dr. Laural Benes, MD  Chief Complaint: Abdominal, chest, jaw, and arm pain Reason for Consult: Unstable angina/NSTEMI  HPI: 60 y.o. 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   Please see below for detailed outline of patient's cardiac history. In short his two most recent cardiac caths from 03/2013 and 01/2014 showed no change from cath in 09/2012 in which he underwent multiple PCI. He has continued to have intermittent chest pain. Most recently prior to this admission he was at [facility] in early June 2017 for RLE graft repair. In the setting of this repair he suffered a stroke involving multiple territories with concern for possible Afib. There was no evidence for Afib upon reviewing EKGs. He was placed on Coumadin only per neurology in an effort to keep RLE graft open. Cardiology recommended Plavix given his multiple PCIs/stenting.   Patient developed lower abdominal pain that ultimately radiated superiorly to his chest, bilateral jaws, and right arm on 6/20 prompting him to come in for evaluation. No associated nausea, vomiting, palpitations, dizziness, presyncope or syncope. There was some diaphoresis. His pain did not feel like any of his other heart attacks.   Upon the patient's arrival to [facility] they were found to  have a troponin of <0.03 at 2:45 AM on 6/21-->0.38. CBC with hgb of 16.5, WBC 9.1, PLT 293. SCr 1.43, unable to report remaining analytes 2/2 lipemic interference. Lipid panel is pending this morning. ECG showed NSR, 78 bpm, nonspecific IVCD, old inferior MI, CXR showed no acute cardiopulmonary process. CTA chest/aorta showed no acute vascular process or acute cardiopulmonary process. Status post CABG with multiple coronary artery stents and severe coronary artery calcifications. Mild cardiomegaly. BP was in the 140's to 150's systolic upon admission. Currently in the 120's systolic. He has been on a nitro gtt since his admission and continues to have intermittent chest and jaw pain. He is asking for Dilaudid stating this is the only medication that works for his pain.    Past Medical History  Diagnosis Date  . CAD (coronary artery disease)     a. s/p multiple MIs; b. S/p CABG; b. s/p multiple PCIs  . Essential hypertension   . Ischemic cardiomyopathy   . Polysubstance abuse   . HLD (hyperlipidemia)   . CKD (chronic kidney disease), stage II   . PAD (peripheral artery disease) (HCC)     a. s/p grafting and stenting  . Stroke [facility])     a. in the setting of RLE graft repair      Most Recent Cardiac Studies: CAD overview:     07/1999 acute inferior MI.   07/27/1999 cardiac cath: EF 44%, a 95% proximal RCA as well as a 75% mid LCX and D1 stenosis.   07/27/1999 PCI to the 95 and 75% proximal RCA stenosis using an AVE stent.  In 2003, 2002 or 2003 he had a PCI at [facility] in [place].   2004 he had a PCI at [facility] in [place].   2005 he had a PCI in [place], [place].   08/2004 PCI in [place], [place].   04/2005 he presented to [facility] with chest pain. Exercise Cardiolite: EF 42% with septal inferior wall hypokinesis and fixed inferior wall defect with no reversible ischemia.   05/08/05 2D echo: EF >55% with insignificant valvular disease.   05/18/05 cardiac  cath([facility]): EF 39%, 75% proximal RCA, D1 and D2 stenosis, 95% OM2 stenosis. Treated medically.   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 Cardiac cath [place], [place]: PCI of a 80% mid LAD stenosis using cypher stents.   06/2006, NSTEMI, PCI of an LCX stenosis using bare metal stent in the setting of cocaine use.   06/2008, PCI of a 80% proximal LCX stenosis using a Xience DES.   08/2009, CABG x 4 (LIMA to LAD, SVG to diagonal and distal RCA), done at [facility].   03/2010, cardiac cath [facility] in [place], [place].): Patent LIMA to LAD, and SVG to LCX, occluded OM1 and RCA, EF 40%.   07/24/2010, cardiac cath: Occluded distal LCX, 99% stenosis in the mid LCX extending to the OM3, 90% stenosis in the mid LAD, 80% stenosis in the proximal LAD, 70% stenosis in the mid LCX and OM3 and 60% stenosis in the D1, occluded SVG to OM2, occluded SVG to D1, occluded SVG to distal RCA, patent LIMA to LAD. PCI to OM2 70% stenosis using a Medtronic Resolute Integrity stent. PCI of 99% mid LCX stenosis also using a Resolute Integrity drug eluting stent.   07/25/2010 post cath non STEMI.   07/27/2010 cardiac cath: Occluded distal LCX, 90% stenosis in the mid LAD, 70% stenosis in the D2, patent LCX and OM2 stents.   11/2010 cardiac cath [facility], [place]) per patient report stable coronary anatomy compared to April 2012 cardiac cath.   04/2011 Nuc. Stress Test [facility] [place], [place]): EF 41%, no ischemia, infarct in inferior/inferolateral region  05/2011 PCI 80% mLCX w/ Xience DES  12/2011 Cardiac Cath: Left main: Normal. CAD 70% proximal, 100% in-stent mid; distal vessel fills via LIMA injection; diffuse 40% apical LAD - D2 - 80% ostial, 80% mid LCX: patent stents prox, diffuse 50% in-stent into OM2, OM1 - 100% in-stent, distal vessel fills via collaterals, OM2 - large vessel, 60% mid, OM3 - 100% in-stent; distal vessel fills via collaterals, RCA: 100% proximal in-stent, Grafts:  LIMA to the LAD: Patent, SVGs are known to be occluded and were not injected  Acute MI 02/11/12: thrombus in LCx stents, txt with a 3.5 x 26 mm Resolute stent complicated by distal embolization.   EF 50% by ECHo 02/12/12 at [facility] with inferior posterioh HK  09/2012 Mild LV dysfunction EF 50% Mild MR, Triv AR, PR, TR  Cath Dec 2014: no change in anatomy, patent LIMA to distal LAD. LAD occluded just after D2. The LAD into D2 is all stented and patent. The LCx is stented into OM2 and all widely patent. The RCA is occluded and the idstal LCx after OM2 is occluded. Should have good perfusion to entire LAD terriroty and most of proximal LCx but collateralized distla LCx and RCA territories.   02/14/2014 cardiac cath: proximal LAD diffuse 20%, mid LAD 100%, D1 discrete 80%, mid LCx diffuse 20%, OM1 100%, OM3 40%, ostial RCA 100%, patent LIMA-LAD and LCx stents, chronically occluded RCA.  No change in coronary anatomy since cath the prior year in 2014     Surgical History:  Past Surgical History  Procedure Laterality Date  . Coronary artery bypass graft    . Toe amputation    . Fasciotomy       Home Meds: Prior to Admission medications   Medication Sig Start Date End Date Taking? Authorizing Provider  atorvastatin (LIPITOR) 80 MG tablet Take 80 mg by mouth every evening.   Yes Historical Provider, MD  fenofibrate (TRICOR) 48 MG tablet Take 48 mg by mouth daily.   Yes Historical Provider, MD  isosorbide mononitrate (IMDUR) 30 MG 24 hr tablet Take 30 mg by mouth daily.   Yes Historical Provider, MD  LYRICA 100 MG capsule Take 100-200 mg by mouth 2 (two) times daily. 100mg  in the morning and 200mg  at bedtime   Yes Historical Provider, MD  metoprolol succinate (TOPROL-XL) 50 MG 24 hr tablet Take 50 mg by mouth daily.   Yes Historical Provider, MD  nitroGLYCERIN (NITROSTAT) 0.4 MG SL tablet Place 0.4 mg under the tongue every 5 (five) minutes as needed for chest pain.   Yes Historical Provider, MD    nortriptyline (PAMELOR) 10 MG capsule Take 20 mg by mouth at bedtime.   Yes Historical Provider, MD  oxyCODONE (OXY IR/ROXICODONE) 5 MG immediate release tablet Take 5 mg by mouth 5 (five) times daily.   Yes Historical Provider, MD  pantoprazole (PROTONIX) 40 MG tablet Take 40 mg by mouth daily.   Yes Historical Provider, MD  ranolazine (RANEXA) 500 MG 12 hr tablet Take 500 mg by mouth 2 (two) times daily.   Yes Historical Provider, MD  senna-docusate (SENOKOT-S) 8.6-50 MG tablet Take 2 tablets by mouth 2 (two) times daily.   Yes Historical Provider, MD  simethicone (MYLICON) 80 MG chewable tablet Chew 80 mg by mouth 4 (four) times daily as needed for flatulence.   Yes Historical Provider, MD  warfarin (COUMADIN) 4 MG tablet Take 4 mg by mouth at bedtime.   Yes Historical Provider, MD  enoxaparin (LOVENOX) 100 MG/ML injection Inject 90 mg into the skin every 12 (twelve) hours. Bridge 09/30/15 10/07/15  Historical Provider, MD    Inpatient Medications:  . atorvastatin  80 mg Oral QPM  . fenofibrate  54 mg Oral Daily  . isosorbide mononitrate  30 mg Oral Daily  . metoprolol succinate  50 mg Oral Daily  . nortriptyline  20 mg Oral QHS  . oxyCODONE  5 mg Oral 5 X Daily  . pantoprazole  40 mg Oral QAC breakfast  . pregabalin  100 mg Oral Daily   And  . pregabalin  200 mg Oral Daily  . ranolazine  500 mg Oral BID  . senna-docusate  2 tablet Oral BID  . sodium chloride flush  3 mL Intravenous Q12H  . sodium chloride flush  3 mL Intravenous Q12H  . warfarin  4 mg Oral q1800  . Warfarin - Physician Dosing Inpatient   Does not apply q1800   . heparin    . nitroGLYCERIN      Allergies: No Known Allergies  Social History   Social History  . Marital Status: Single    Spouse Name: N/A  . Number of Children: N/A  . Years of Education: N/A   Occupational History  . Not on file.   Social History Main Topics  . Smoking status: Former Smoker -- 0.50 packs/day for 30 years    Types:  Cigarettes  Quit date: 04/21/2015  . Smokeless tobacco: Not on file  . Alcohol Use: 8.4 oz/week    14 Standard drinks or equivalent per week  . Drug Use: No     Comment: previously used cocaine - last used 2008  . Sexual Activity: Not on file   Other Topics Concern  . Not on file   Social History Narrative  . No narrative on file     Family History  Problem Relation Age of Onset  . CAD Father   . Hypertension Father   . Heart failure Father   . Heart disease Maternal Grandmother      Review of Systems: Review of Systems  Constitutional: Positive for malaise/fatigue and diaphoresis. Negative for fever, chills and weight loss.  HENT: Negative for congestion.   Eyes: Negative for discharge and redness.  Respiratory: Positive for shortness of breath. Negative for cough, hemoptysis, sputum production and wheezing.   Cardiovascular: Positive for chest pain. Negative for palpitations, orthopnea, claudication, leg swelling and PND.  Gastrointestinal: Positive for abdominal pain. Negative for heartburn, nausea, vomiting, diarrhea, constipation and blood in stool.  Genitourinary: Negative for hematuria.  Musculoskeletal: Negative for myalgias and falls.  Skin: Negative for rash.  Neurological: Positive for weakness. Negative for dizziness, tingling, tremors, sensory change, speech change, focal weakness, loss of consciousness and headaches.  Endo/Heme/Allergies: Does not bruise/bleed easily.  Psychiatric/Behavioral: Negative for substance abuse. The patient is not nervous/anxious.   All other systems reviewed and are negative.   Labs:  Recent Labs  10/08/15 0245  TROPONINI <0.03   Lab Results  Component Value Date   WBC 9.1 10/08/2015   HGB 16.5 10/08/2015   HCT 46.8 10/08/2015   MCV 98.2 10/08/2015   PLT 293 10/08/2015     Recent Labs Lab 10/08/15 0245  NA UNABLE TO REPORT DUE TO LIPEMIC INTERFERENCE  K UNABLE TO REPORT DUE TO LIPEMIC INTERFERENCE  CL 101  CO2  UNABLE TO REPORT DUE TO LIPEMIC INTERFERENCE  BUN 19  CREATININE 1.43*  CALCIUM UNABLE TO REPORT DUE TO LIPEMIC INTERFERENCE  PROT UNABLE TO REPORT DUE TO LIPEMIC INTERFERENCE  BILITOT 0.6  ALKPHOS 54  ALT UNABLE TO REPORT DUE TO LIPEMIC INTERFERENCE  AST 34  GLUCOSE UNABLE TO REPORT DUE TO LIPEMIC INTERFERENCE   Lab Results  Component Value Date   CHOL 135 02/14/2014   HDL 30* 02/14/2014   LDLCALC SEE COMMENT 02/14/2014   TRIG 441* 02/14/2014   No results found for: DDIMER  Radiology/Studies:  Dg Chest Portable 1 View  10/08/2015  IMPRESSION: No acute cardiopulmonary process seen. Electronically Signed   By: Roanna Raider M.D.   On: 10/08/2015 03:20   Ct Angio Chest Aorta W/cm &/or Wo/cm  10/08/2015  IMPRESSION: No acute vascular process or acute cardiopulmonary process. Status post CABG, multiple coronary artery stents and severe coronary artery calcifications. Mild cardiomegaly. Electronically Signed   By: Awilda Metro M.D.   On: 10/08/2015 05:12    EKG: Interpreted by me showed: NSR, 78 bpm, nonspecific IVCD, old inferior MI Telemetry: Interpreted by me showed: NSR 70's bpm  Weights: [employer]   10/08/15 0538 10/08/15 0704  Weight: 237 lb 6 oz (107.673 kg) 228 lb 4.8 oz (103.556 kg)     Physical Exam: Blood pressure 126/85, pulse 81, temperature 97.4 F (36.3 C), temperature source Oral, resp. rate 18, height 6' (1.829 m), weight 228 lb 4.8 oz (103.556 kg), SpO2 96 %. Body mass index is 30.96 kg/(m^2). General: Well developed,  well nourished, in no acute distress. Head: Normocephalic, atraumatic, sclera non-icteric, no xanthomas, nares are without discharge.  Neck: Negative for carotid bruits. JVD not elevated. Lungs: Clear bilaterally to auscultation without wheezes, rales, or rhonchi. Breathing is unlabored. Heart: RRR with S1 S2. No murmurs, rubs, or gallops appreciated. Abdomen: Soft, non-tender, non-distended with normoactive bowel sounds. No  hepatomegaly. No rebound/guarding. No obvious abdominal masses. Msk:  Strength and tone appear normal for age. Extremities: No clubbing or cyanosis. No edema. Multiple well healed surgical scars from grafts. Status post right great toe amputation.  Neuro: Alert and oriented X 3. No facial asymmetry. No focal deficit. Moves all extremities spontaneously. Psych:  Responds to questions appropriately with a normal affect.    Assessment and Plan:  Principal Problem:   Unstable angina (HCC) Active Problems:   CAD in native artery   History of substance abuse   Ischemic cardiomyopathy   PAD (peripheral artery disease) (HCC)   CKD (chronic kidney disease), stage II   Essential hypertension   HLD (hyperlipidemia)    1. NSTEMI/CAD s/p CABG and multiple PCI as above: -Medically manage given recent stroke  -Add Plavix given extensive stenting  -If he is still having symptoms in several weeks will readdress patient based on his symptoms (primary cardiologist) -Heparin gtt x 48 hours -On Coumadin as of early June 2017 secondary to RLE occlusion and grafting as well [facility] (INR 1.78 this morning). Hold Coumadin for today, can reassess 6/22 -Toprol XL 50 mg, Lipitor 80 mg, Imdur once off nitro gtt -Increase Ranexa to 1000 mg bid -Check echo to evaluate LVSF and wall motion  2. Recent stroke involving multiple territories: -Concerning for embolic events -[facility], EKGs are documented as sinus rhythm -Uncertain at this time if patient did in fact did have Afib -[facility], cardiology recommended antiplatelet therapy given his CAD. Neurology felt Coumadin alone was safest   3. Possible history of Afib: -As above -No evidence of Afib thus far this admission -Monitor on telemetry, if no Afib is seen recommend outpatient cardiac monitoring through primary cardiologist  -On Coumadin for possible Afib and  in an effort to possibly keep RLE graft open -Continue Toprol XL for rate control -CHADS2VASc at least 5 (CHF, HTN, stroke x 2, vascular disease)  4. PAD s/p recent RLE graft repair: -Stable -Per primary cardiologist   5. Ischemic cardiomyopathy: -He does not appear to be volume overloaded at this time -Check echo as above -Continue Toprol XL  6. Polysubstance abuse: -He reports last using cocaine in 2008 -Ongoing alcohol abuse -Quit tobacco 04/2015 -Asking only for Dilaudid -Check UDS  7. HTN: -Improved -Continue current medications  8. HLD: -Lipitor -FLP pending  9. CKD stage II: -Stable -Limit IV contrast and nephrotoxic agents   Signed, Eula ListenRyan Efstathios Sawin, PA-C [facility] Pager: [phone] 10/08/2015, 7:52 AM

## 2015-10-08 NOTE — ED Notes (Addendum)
Pt presents to ED via [facility] for chest pain that he states started 30 minutes PTA of EMS but will not give this RN a time. Pt is moaning and holding L side of chest. Per EMS pt has hx of 16 stents, CABG and MI 3 years ago, Pt told this RN he has over 30 stents. Pt took 2 Nitro at home, EMS gave 2 more nitro and 324mg  of aspirin en route.

## 2015-10-08 NOTE — Progress Notes (Signed)
ANTICOAGULATION CONSULT NOTE - Initial Consult  Pharmacy Consult for heparin Indication: chest pain/ACS  No Known Allergies  Patient Measurements: Height: 6' (182.9 cm) Weight: 228 lb 4.8 oz (103.556 kg) IBW/kg (Calculated) : 77.6 Heparin Dosing Weight: 100.2 kg  Vital Signs: Temp: 98 F (36.7 C) (06/21 1500) Temp Source: Oral (06/21 1500) BP: 110/73 mmHg (06/21 1500) Pulse Rate: 77 (06/21 1500)  Labs:  Recent Labs  10/08/15 0245 10/08/15 0606 10/08/15 0709 10/08/15 1237 10/08/15 1555  HGB 16.5  --   --   --   --   HCT 46.8  --   --   --   --   PLT 293  --   --   --   --   APTT 47*  --   --   --   --   LABPROT 20.7*  --   --   --   --   INR 1.78  --   --   --   --   HEPARINUNFRC  --  <0.10*  --   --  0.21*  CREATININE 1.43*  --   --   --   --   TROPONINI <0.03  --  0.38* 7.49*  --     Estimated Creatinine Clearance: 67.5 mL/min (by C-G formula based on Cr of 1.43).   Medical History: Past Medical History  Diagnosis Date  . CAD (coronary artery disease)     a. s/p multiple MIs; b. S/p CABG; b. s/p multiple PCIs  . Essential hypertension   . Ischemic cardiomyopathy   . Polysubstance abuse   . HLD (hyperlipidemia)   . CKD (chronic kidney disease), stage II   . PAD (peripheral artery disease) (HCC)     a. s/p grafting and stenting  . Stroke [facility](HCC)     a. in the setting of RLE graft repair    Medications:  Infusions:  . heparin 1,200 Units/hr (10/08/15 1500)  . nitroGLYCERIN 50 mcg/min (10/08/15 1455)    Assessment: 59 yom cc CP. Is on [facility] bridge to [facility]. INR 1.78, aPTT 47, HL pending. Pharmacy consulted to dose heparin for ACS.   Goal of Therapy:  Heparin level 0.3-0.7 units/ml Monitor platelets by anticoagulation protocol: Yes   Plan:  HL= 0.21. Will bolus heparin 1500 units iv once and increase infusion to 1400 units/hr. Will recheck a HL in 6 hours.   Luisa Harthristy, Kawthar Ennen D, Pharm.D., BCPS Clinical Pharmacist 10/08/2015,4:25 PM

## 2015-10-08 NOTE — ED Provider Notes (Signed)
[facility] Emergency Department Provider Note   ____________________________________________  Time seen: Approximately 2:43 AM  I have reviewed the triage vital signs and the nursing notes.   HISTORY  Chief Complaint Chest Pain  Caveat-history of present illness and review of systems Limited due to the patient's pain.  HPI NAWAF STRANGE is a 60 y.o. male with history of extensive coronary artery disease status post CABG as well as multiple PCI's, hypertension, hyperlipidemia, OSA on CPAP, GERD, recent discharge from [facility] on 09/30/2015 after treatment for right lower extremity acute limb ischemia as well as an embolic stroke, he appears to currently be on on Lovenox bridge Coumadin who presents for evaluation of sudden onset sternal chest pain radiating into the right arm that began approximately an hour ago, gradual onset, constant, no modifying factors. He called 911, and on their arrival, he received 4 subungual nitroglycerin as well as full dose aspirin with no improvement of his pain.    Past Medical History  Diagnosis Date  . Heart attack (HCC)   . Hypertension     There are no active problems to display for this patient.   Past Surgical History  Procedure Laterality Date  . Coronary artery bypass graft      Current Outpatient Rx  Name  Route  Sig  Dispense  Refill  . atorvastatin (LIPITOR) 80 MG tablet   Oral   Take 80 mg by mouth every evening.         . fenofibrate (TRICOR) 48 MG tablet   Oral   Take 48 mg by mouth daily.         . isosorbide mononitrate (IMDUR) 30 MG 24 hr tablet   Oral   Take 30 mg by mouth daily.         Marland Kitchen LYRICA 100 MG capsule   Oral   Take 100-200 mg by mouth 2 (two) times daily. 100mg  in the morning and 200mg  at bedtime           Dispense as written.   . metoprolol succinate (TOPROL-XL) 50 MG 24 hr tablet   Oral   Take 50 mg by mouth daily.         . nitroGLYCERIN (NITROSTAT) 0.4 MG SL  tablet   Sublingual   Place 0.4 mg under the tongue every 5 (five) minutes as needed for chest pain.         . nortriptyline (PAMELOR) 10 MG capsule   Oral   Take 20 mg by mouth at bedtime.         Marland Kitchen oxyCODONE (OXY IR/ROXICODONE) 5 MG immediate release tablet   Oral   Take 5 mg by mouth 5 (five) times daily.         . pantoprazole (PROTONIX) 40 MG tablet   Oral   Take 40 mg by mouth daily.         . ranolazine (RANEXA) 500 MG 12 hr tablet   Oral   Take 500 mg by mouth 2 (two) times daily.         Marland Kitchen senna-docusate (SENOKOT-S) 8.6-50 MG tablet   Oral   Take 2 tablets by mouth 2 (two) times daily.         . simethicone (MYLICON) 80 MG chewable tablet   Oral   Chew 80 mg by mouth 4 (four) times daily as needed for flatulence.         . warfarin (COUMADIN) 4 MG tablet   Oral  Take 4 mg by mouth at bedtime.         Marland Kitchen. EXPIRED: enoxaparin (LOVENOX) 100 MG/ML injection   Subcutaneous   Inject 90 mg into the skin every 12 (twelve) hours. Bridge           Allergies Review of patient's allergies indicates no known allergies.  No family history on file.  Social History Social History  Substance Use Topics  . Smoking status: None  . Smokeless tobacco: None  . Alcohol Use: None    Review of Systems  Cardiovascular: + chest pain. Respiratory: + shortness of breath.  Caveat-history of present illness and review of systems Limited due to the patient's pain. ____________________________________________   PHYSICAL EXAM:  Filed Vitals:   10/08/15 0505 10/08/15 0510 10/08/15 0515 10/08/15 0538  BP: 129/86 132/75 132/76   Pulse: 79 81 80   Temp:      TempSrc:      Resp: 18 21 18    Height:    6' (1.829 m)  Weight:    237 lb 6 oz (107.673 kg)  SpO2: 97% 97% 97%      Constitutional: Alert and oriented. In severe distress secondary to pain, moaning. Eyes: Conjunctivae are normal. PERRL. EOMI. Head: Atraumatic. Nose: No  congestion/rhinnorhea. Mouth/Throat: Mucous membranes are moist.  Oropharynx non-erythematous. Neck: No stridor.  Supple without meningismus. Cardiovascular: Normal rate, regular rhythm. Grossly normal heart sounds.  Good peripheral circulation. Respiratory: Normal respiratory effort.  No retractions. Lungs CTAB. Gastrointestinal: Soft and nontender. No distention. No CVA tenderness. Genitourinary: deferred Musculoskeletal: No lower extremity tenderness nor edema.  No joint effusions. The feet are warm and well-perfused, 2+ DP pulse bilaterally. Neurologic:  Normal speech and language. No gross focal neurologic deficits are appreciated.  Skin:  Skin is warm, dry and intact. No rash noted. Psychiatric: Mood and affect are normal. Speech and behavior are normal.  ____________________________________________   LABS (all labs ordered are listed, but only abnormal results are displayed)  Labs Reviewed  CBC WITH DIFFERENTIAL/PLATELET - Abnormal; Notable for the following:    RBC 3.95 (*)    MCH 42.2 (*)    Eosinophils Absolute 0.9 (*)    All other components within normal limits  COMPREHENSIVE METABOLIC PANEL - Abnormal; Notable for the following:    Creatinine, Ser 1.43 (*)    GFR calc non Af Amer 51 (*)    GFR calc Af Amer 60 (*)    All other components within normal limits  PROTIME-INR - Abnormal; Notable for the following:    Prothrombin Time 20.7 (*)    All other components within normal limits  APTT - Abnormal; Notable for the following:    aPTT 47 (*)    All other components within normal limits  TROPONIN I   ____________________________________________  EKG  ED ECG REPORT I, Gayla DossGayle, Azariya Freeman A, the attending physician, personally viewed and interpreted this ECG.   Date: 10/08/2015  EKG Time: 02:42  Rate: 80  Rhythm: normal sinus rhythm  Axis: normal  Intervals:none  ST&T Change: Less than 1 mm of ST elevation in lead 3 and aVF though some appears to be artifactual  secondary to movement, ST depression in V4, V5. Q waves in inferior leads.   ED ECG REPORT I, Gayla DossGayle, Hendel Gatliff A, the attending physician, personally viewed and interpreted this ECG.   Date: 10/08/2015  EKG Time: 02:51  Rate: 80  Rhythm: normal sinus rhythm  Axis: left  Intervals:none  ST&T Change: Less than 1 mm of  ST elevation in aVF. ST depression in V4, V5. Q waves in inferior leads.  ED ECG REPORT I, Gayla Doss, the attending physician, personally viewed and interpreted this ECG.   Date: 10/08/2015  EKG Time: 04:04   Rate: 78  Rhythm: normal sinus rhythm  Axis: normal  Intervals:none  ST&T Change: Less than 1 mm of ST elevation in lead 3 and aVF. Unable to appreciate ST depression in V4 and V5 secondary to motion artifact.   ____________________________________________  RADIOLOGY  CXR IMPRESSION: No acute cardiopulmonary process seen.  CTA chest IMPRESSION: No acute vascular process or acute cardiopulmonary process.  Status post CABG, multiple coronary artery stents and severe coronary artery calcifications. Mild cardiomegaly. ____________________________________________   PROCEDURES  Procedure(s) performed: None  Critical Care performed: No  ____________________________________________   INITIAL IMPRESSION / ASSESSMENT AND PLAN / ED COURSE  Pertinent labs & imaging results that were available during my care of the patient were reviewed by me and considered in my medical decision making (see chart for details).  KWAN SHELLHAMMER is a 60 y.o. male with history of extensive coronary artery disease status post CABG as well as multiple PCI's, hypertension, hyperlipidemia, OSA on CPAP, GERD, recent discharge from [facility] on 09/30/2015 after treatment for right lower extremity acute limb ischemia as well as an embolic stroke who presents for evaluation of sudden onset chest pain. On exam, he is in severe distress secondary to pain. Initial EKG was equivocal for STEMI  so I discussed the case with Dr. Jacinto Halim of [facility] bat 3:01 am as he is currently on-call, he has reviewed the EKG, reports it does not meet criteria for STEMI. My concern is for ACS. we will treat his pain, give nitroglycerin, obtain screening labs, chest x-ray, anticipate admission.  ----------------------------------------- 5:40 AM on 10/08/2015 ----------------------------------------- Labs reviewed. CBC generally unremarkable. CMP with significant lipemia, multiple values are all reportable, creatinine 1.43. Initial troponin is negative. INR 1.78, subtherapeutic. Chest x-ray was obtained, no acute cardiopulmonary process. Given his persistent pain, CTA chest was obtained which shows no acute dissection. I ordered heparin drip as well as nitroglycerin drip, multiple doses of morphine and dilaudid and  patient still with only moderate improvement of his symptoms. I discussed the case with the hospitalist at this time for admission. ____________________________________________   FINAL CLINICAL IMPRESSION(S) / ED DIAGNOSES  Final diagnoses:  Chest pain, unspecified chest pain type      NEW MEDICATIONS STARTED DURING THIS VISIT:  New Prescriptions   No medications on file     Note:  This document was prepared using Dragon voice recognition software and may include unintentional dictation errors.    Gayla Doss, MD 10/08/15 [phone]

## 2015-10-08 NOTE — Progress Notes (Signed)
   Patient prefers to be transferred to [facility] at this time where he can follow up with his primary cardiologist and possibly undergo cardiac catheterization if they felt this would be safe given his recent stroke 2 weeks ago. Will page IM and have [facility]

## 2015-10-08 NOTE — Progress Notes (Signed)
Patient's chest pain is getting worse with sharp pain. Patient is also short of breath. Placed supplemental oxygen on. Vitals are still stable. Patient has received 2mg  of dilaudid one time dose per Dr. Herbie BaltimoreHarding, his scheduled oxycodone, and another 1 mg of dilaudid per Dr. Clint GuyHower. Paged Dr. Herbie BaltimoreHarding to inform of increased chest pain. MD acknowledged and stated to have troponin drawn stat now. Lab called and notified.

## 2015-10-08 NOTE — Progress Notes (Signed)
Patient arrived to 2A right before shift change. Night shift completed admission and patient was already receiving nitro drip at 3235mcg/hr. Starting to titrate up now. Patient is still experiencing 6/10 chest pain with some shortness of breath. Denies putting oxygen on, stating it does not help. Night shift RN reported off that they gave morphine dose around 7AM, not scanned, but will go off this time to give another dose. Already spoke with cardiology this morning about patients troponin and pain level. Currently consulting to decide if patient can have catheterization today due to recent stroke. NPO for now. Patient requested dilaudid for pain medication and cardiology denied at this time, will continue with nitro drip and closely monitor. Vitals are stable, NSR on tele.

## 2015-10-08 NOTE — Progress Notes (Signed)
ANTICOAGULATION CONSULT NOTE - Initial Consult  Pharmacy Consult for heparin Indication: chest pain/ACS  No Known Allergies  Patient Measurements: Height: 6' (182.9 cm) Weight: 228 lb 4.8 oz (103.556 kg) IBW/kg (Calculated) : 77.6 Heparin Dosing Weight: 100.2 kg  Vital Signs: Temp: 97.4 F (36.3 C) (06/21 0704) Temp Source: Oral (06/21 0317) BP: 118/72 mmHg (06/21 0855) Pulse Rate: 83 (06/21 0855)  Labs:  Recent Labs  10/08/15 0245 10/08/15 0606 10/08/15 0709  HGB 16.5  --   --   HCT 46.8  --   --   PLT 293  --   --   APTT 47*  --   --   LABPROT 20.7*  --   --   INR 1.78  --   --   HEPARINUNFRC  --  <0.10*  --   CREATININE 1.43*  --   --   TROPONINI <0.03  --  0.38*    Estimated Creatinine Clearance: 67.5 mL/min (by C-G formula based on Cr of 1.43).   Medical History: Past Medical History  Diagnosis Date  . CAD (coronary artery disease)     a. s/p multiple MIs; b. S/p CABG; b. s/p multiple PCIs  . Essential hypertension   . Ischemic cardiomyopathy   . Polysubstance abuse   . HLD (hyperlipidemia)   . CKD (chronic kidney disease), stage II   . PAD (peripheral artery disease) (HCC)     a. s/p grafting and stenting  . Stroke [facility](HCC)     a. in the setting of RLE graft repair    Medications:  Infusions:  . heparin    . nitroGLYCERIN 45 mcg/min (10/08/15 0900)    Assessment: 59 yom cc CP. Is on LM[facility] bridge to [facility]. INR 1.78, aPTT 47, HL pending. Pharmacy consulted to dose heparin for ACS.   Goal of Therapy:  Heparin level 0.3-0.7 units/ml Monitor platelets by anticoagulation protocol: Yes   Plan:  Give 2000 units bolus x 1 Start heparin infusion at 1200 units/hr Check anti-Xa level in 6 hours and daily while on heparin Continue to monitor H&H and platelets   6/21- note: RN called- Cardiologist had told RN to not hang Heparin drip then changed mind and told RN to start. So drip not started till 9:15ish or after.  HL rescheduled to  1600.  Sade Hollon A, Pharm.D., BCPS Clinical Pharmacist 10/08/2015,9:14 AM

## 2015-10-08 NOTE — Progress Notes (Signed)
Patient's wife, Vickey HugerLana, has been updated with plan of care twice today and is aware of ICU transfer.

## 2015-10-08 NOTE — Progress Notes (Signed)
Heparin drip was originally discontinued due to patient possibly going to cath lab and being on coumadin at home. Per Dr. Herbie BaltimoreHarding now that patient is not getting cardiac cath, start on heparin drip. Pharmacy called and notified.

## 2015-10-08 NOTE — Progress Notes (Signed)
Pt brought over to CCU from 2A with c/o persistant CP. Upon arrival, pt A&O x4 with c/o right upper CP 7/10 radiating to right arm and jaw. Pt currently on a heparin and Nitro gtt. NSR with a first degree heart block on cardiac monitor. Lung sounds are clear to auscultation, sating 100% on 2LNC. PRN dilaudid 2mg  given at 1449 with slight improvement in pain 5/10; however, at this time, his pain has resumed to a 7/10.  [facility]

## 2015-10-09 LAB — HEPARIN LEVEL (UNFRACTIONATED): Heparin Unfractionated: 0.36 IU/mL (ref 0.30–0.70)

## 2015-10-09 NOTE — Progress Notes (Signed)
Md informed of pt Troponin of 18.1.  Pt constant c/o pain 7/10 even with pain meds, MD informed with no new orders.  Cardiology informed for pt troponin and pt c/o pain.  New orders given.  [facility], [facility]

## 2015-10-09 NOTE — Progress Notes (Signed)
ANTICOAGULATION CONSULT NOTE - Initial Consult  Pharmacy Consult for heparin Indication: chest pain/ACS  No Known Allergies  Patient Measurements: Height: 6' (182.9 cm) Weight: 228 lb 4.8 oz (103.556 kg) IBW/kg (Calculated) : 77.6 Heparin Dosing Weight: 100.2 kg  Vital Signs: Temp: 98.1 F (36.7 C) (06/21 2000) Temp Source: Oral (06/21 2000) BP: 120/72 mmHg (06/22 0000) Pulse Rate: 81 (06/22 0000)  Labs:  Recent Labs  10/08/15 0245 10/08/15 0606 10/08/15 0709 10/08/15 1237 10/08/15 1555 10/08/15 1927 10/08/15 2323  HGB 16.5  --   --   --   --   --   --   HCT 46.8  --   --   --   --   --   --   PLT 293  --   --   --   --   --   --   APTT 47*  --   --   --   --   --   --   LABPROT 20.7*  --   --   --   --   --   --   INR 1.78  --   --   --   --   --   --   HEPARINUNFRC  --  <0.10*  --   --  0.21*  --  0.36  CREATININE 1.43*  --   --   --   --   --   --   TROPONINI <0.03  --  0.38* 7.49*  --  18.01*  --     Estimated Creatinine Clearance: 67.5 mL/min (by C-G formula based on Cr of 1.43).   Medical History: Past Medical History  Diagnosis Date  . CAD (coronary artery disease)     a. s/p multiple MIs; b. S/p CABG; b. s/p multiple PCIs  . Essential hypertension   . Ischemic cardiomyopathy   . Polysubstance abuse   . HLD (hyperlipidemia)   . CKD (chronic kidney disease), stage II   . PAD (peripheral artery disease) (HCC)     a. s/p grafting and stenting  . Stroke [facility](HCC)     a. in the setting of RLE graft repair    Medications:  Infusions:  . heparin 1,400 Units/hr (10/08/15 1625)  . nitroGLYCERIN 40 mcg/min (10/08/15 2232)    Assessment: 59 yom cc CP. Is on LMHW bridge to [facility]. INR 1.78, aPTT 47, HL pending. Pharmacy consulted to dose heparin for ACS.   Goal of Therapy:  Heparin level 0.3-0.7 units/ml Monitor platelets by anticoagulation protocol: Yes   Plan:  Heparin level therapeutic x 1. Continue current rate. Will recheck level in 6  hours.  Carola FrostNathan A Edessa Jakubowicz, Pharm.D., BCPS Clinical Pharmacist 10/09/2015,12:40 AM

## 2015-10-28 IMAGING — CT CT ABD-PELV W/ CM
2 of 5 series · 16 of 46 positions shown, 18 images · IV contrast (isovue)
Comparison: CT of the abdomen and pelvis performed 03/06/2013

CLINICAL DATA: Diffuse abdominal pain for 3 hours. Nausea and
diarrhea. Shortness of breath.

EXAM:
CT ABDOMEN AND PELVIS WITH CONTRAST
TECHNIQUE: Multidetector CT imaging of the abdomen and pelvis was performed
using the standard protocol following bolus administration of
intravenous contrast.
CONTRAST:  125 mL of Isovue 370 IV contrast

[Series 2: routine abd pel with · axial · 0.80mm/px · z∈[-533,-88]mm · 13 of 101 slices shown, 15 images]
[im 6/101  soft-tissue]
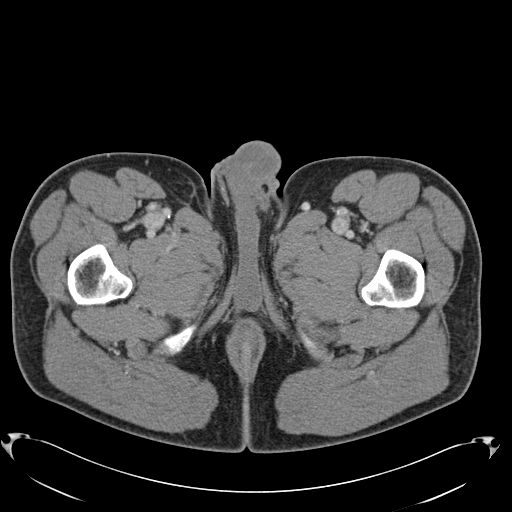
[im 6/101  bone]
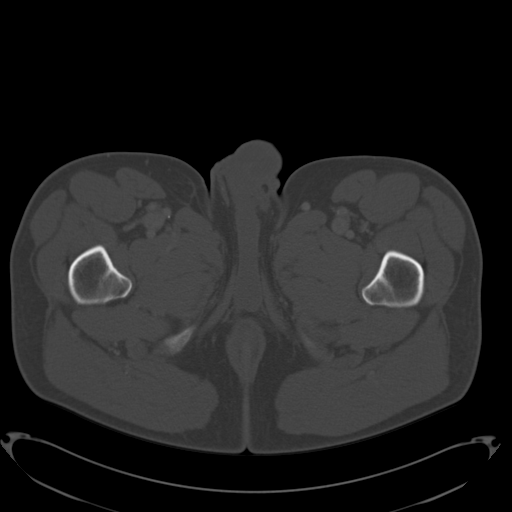
[im 12/101  soft-tissue]
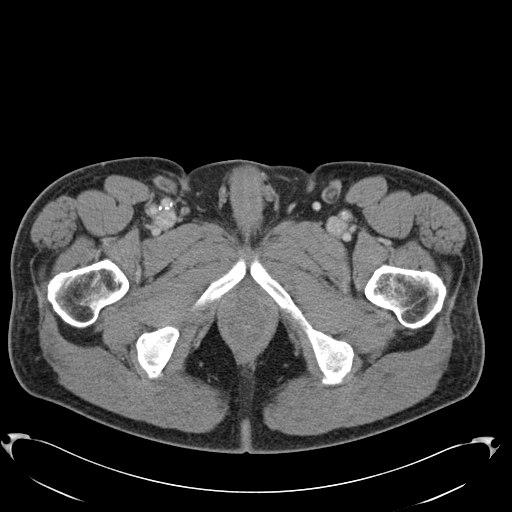
[im 24/101  soft-tissue]
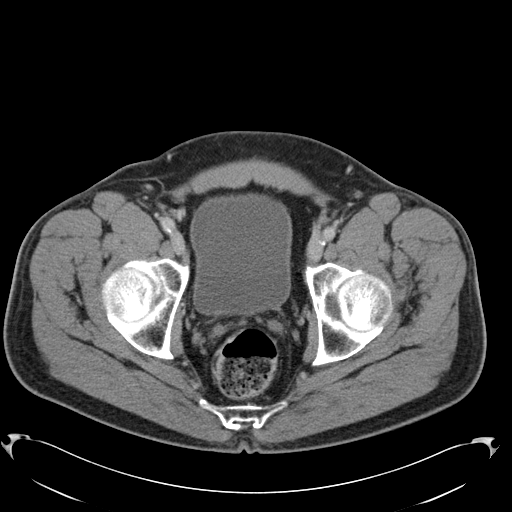
[im 30/101  soft-tissue]
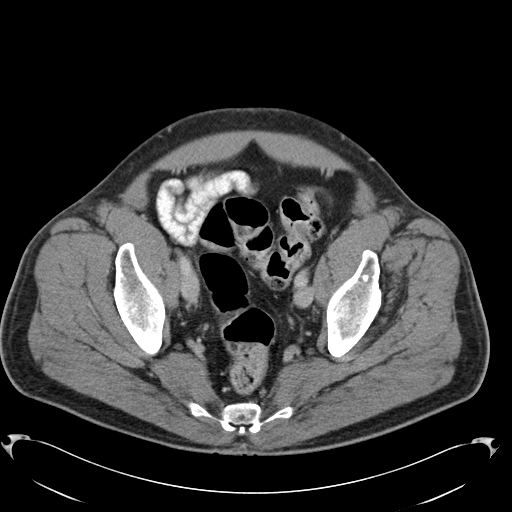
[im 36/101  soft-tissue]
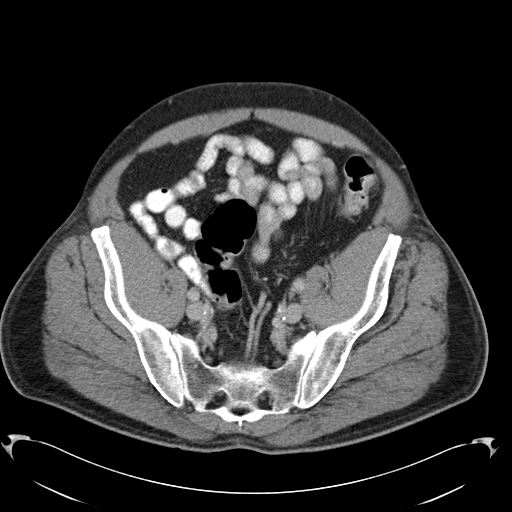
[im 42/101  soft-tissue]
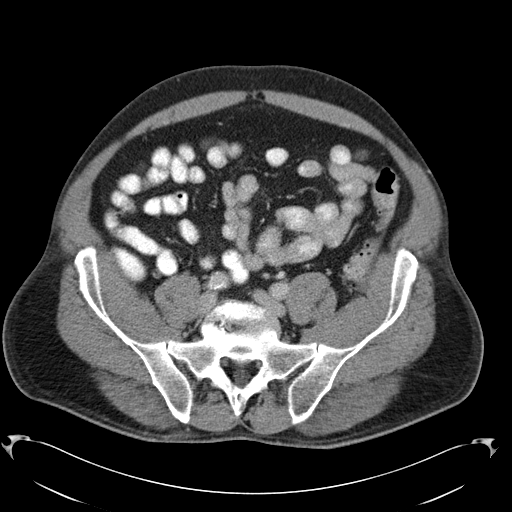
[im 53/101  soft-tissue]
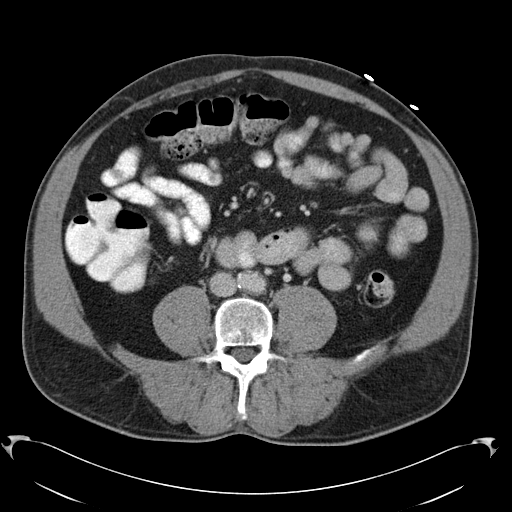
[im 59/101  soft-tissue]
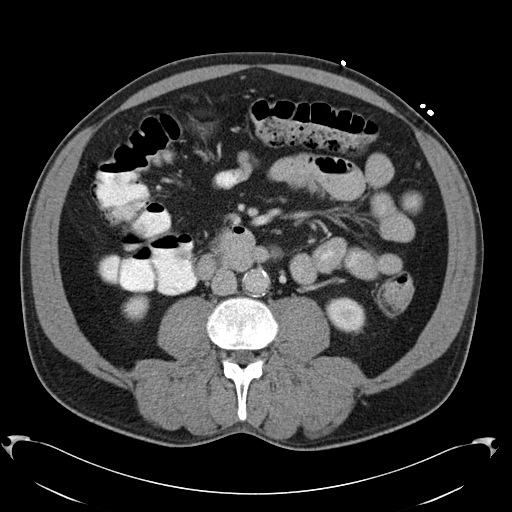
[im 65/101  soft-tissue]
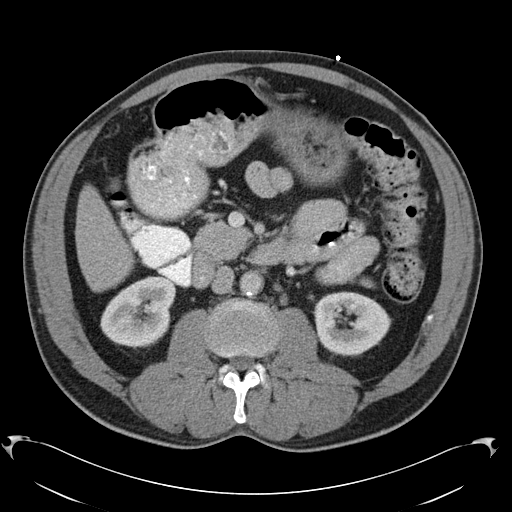
[im 65/101  bone]
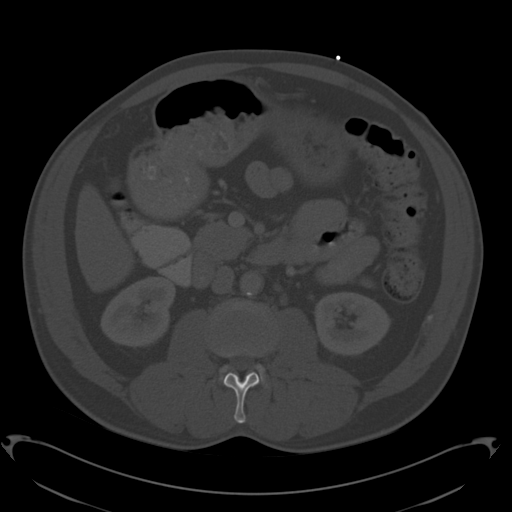
[im 71/101  soft-tissue]
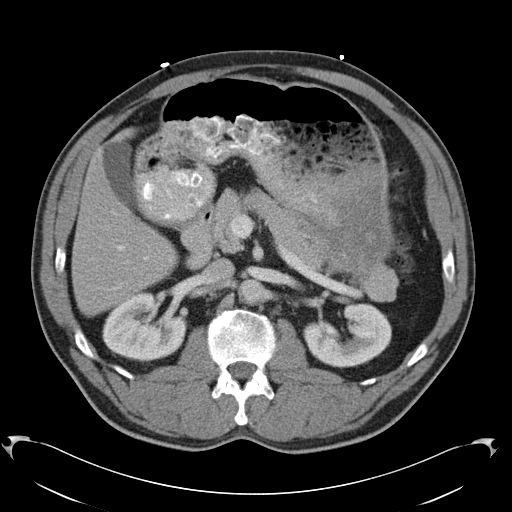
[im 77/101  soft-tissue]
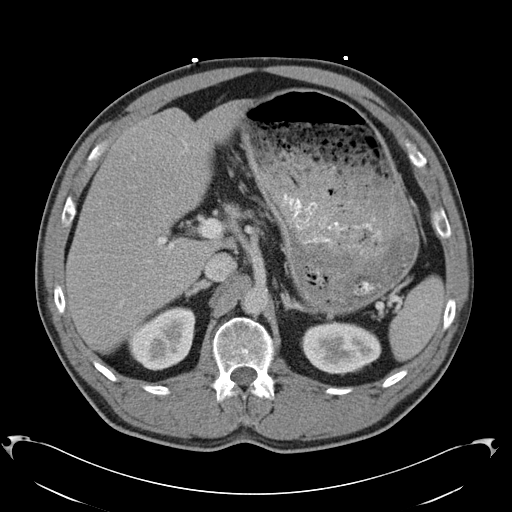
[im 89/101  soft-tissue]
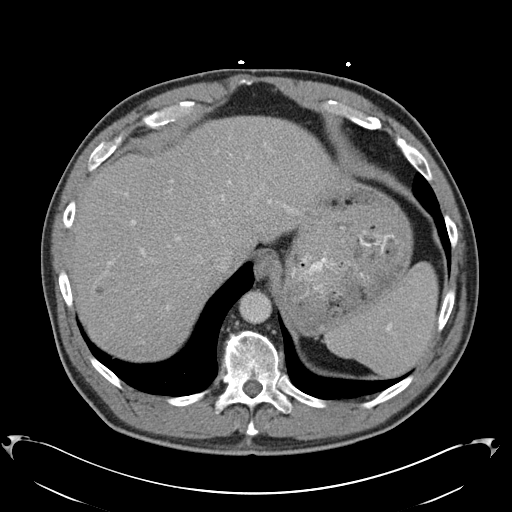
[im 95/101  soft-tissue]
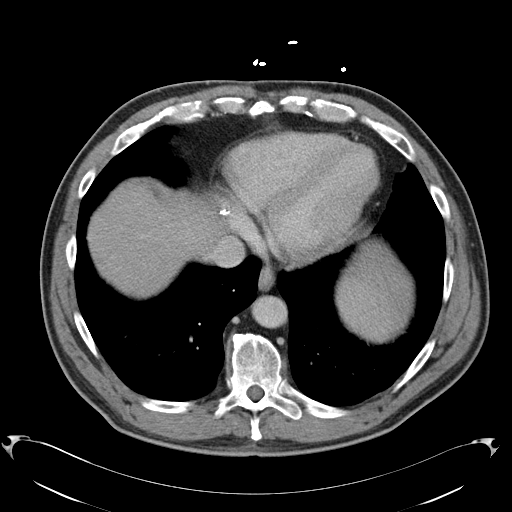

[Series 5: cor routine abd pel with · coronal · 1.03mm/px · 3 of 150 slices shown]
[im 50/150  soft-tissue]
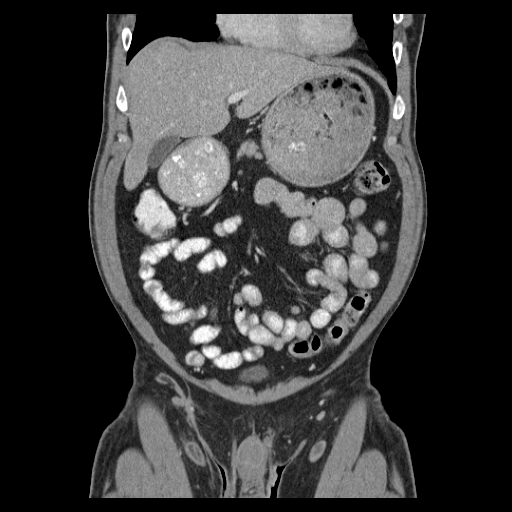
[im 67/150  soft-tissue]
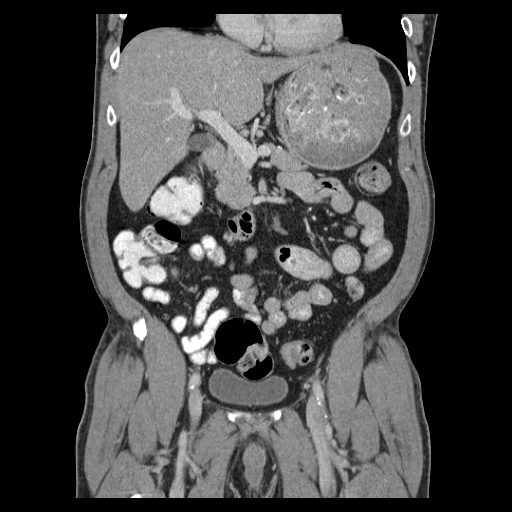
[im 83/150  soft-tissue]
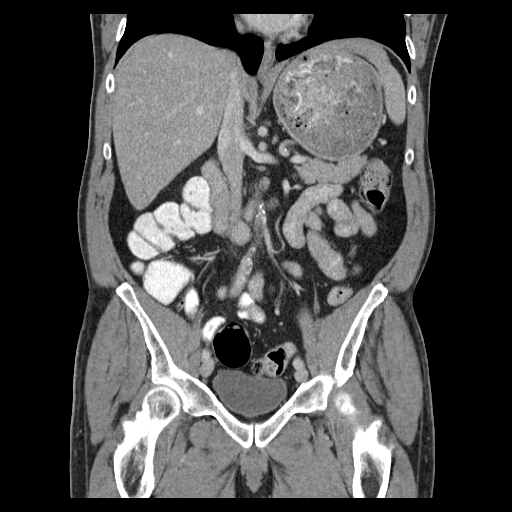

[16 of 46 positions shown; findings below may reference images not displayed]

FINDINGS: The visualized lung bases are clear. Relatively diffuse coronary
artery calcifications are seen. The patient is status post median
sternotomy.

Scattered tiny hypodensities are seen within the liver, likely
reflecting small cysts. The liver is otherwise unremarkable in
appearance. The spleen is within normal limits. The gallbladder is
within normal limits. The pancreas and adrenal glands are
unremarkable.

The kidneys are unremarkable in appearance. There is no evidence of
hydronephrosis. No renal or ureteral stones are seen. Mild
nonspecific perinephric stranding is noted bilaterally.

No free fluid is identified. The small bowel is unremarkable in
appearance. The stomach is filled with solid material and is within
normal limits. No acute vascular abnormalities are seen. Mild
scattered calcification is noted along the distal abdominal aorta
and its branches, with mild associated intramural thrombus. There is
slight ectasia of the distal abdominal aorta, without evidence of
aneurysmal dilatation.

The appendix is normal in caliber and contains air, without evidence
for appendicitis. Contrast progresses to the level of the transverse
colon. The colon is unremarkable in appearance.

The bladder is mildly distended and grossly unremarkable. The
prostate remains normal in size with scattered calcification. Mildly
prominent bilateral inguinal nodes have normal configuration,
measuring up to 1.3 cm in short axis. These likely reflect the
patient's baseline.

No acute osseous abnormalities are identified.
IMPRESSION: 1. No acute abnormality seen to explain the patient's symptoms.
2. Relatively diffuse coronary artery calcifications seen.
3. Likely small hepatic cysts seen.
4. Mild scattered calcification along the distal abdominal aorta and
its branches, with mild associated intramural thrombus but no
evidence of luminal narrowing.
5. Mildly prominent bilateral inguinal nodes are grossly stable and
likely reflect the patient's baseline.

## 2016-01-27 ENCOUNTER — Encounter: Payer: Self-pay | Admitting: Emergency Medicine

## 2016-01-27 ENCOUNTER — Emergency Department
Admission: EM | Admit: 2016-01-27 | Discharge: 2016-01-27 | Disposition: A | Discharge status: Home / Self Care | Payer: BLUE CROSS/BLUE SHIELD | Attending: Emergency Medicine | Admitting: Emergency Medicine

## 2016-01-27 DIAGNOSIS — I129 Hypertensive chronic kidney disease with stage 1 through stage 4 chronic kidney disease, or unspecified chronic kidney disease: Secondary | ICD-10-CM | POA: Diagnosis not present

## 2016-01-27 DIAGNOSIS — Z951 Presence of aortocoronary bypass graft: Secondary | ICD-10-CM | POA: Diagnosis not present

## 2016-01-27 DIAGNOSIS — M7021 Olecranon bursitis, right elbow: Secondary | ICD-10-CM

## 2016-01-27 DIAGNOSIS — Z7901 Long term (current) use of anticoagulants: Secondary | ICD-10-CM | POA: Insufficient documentation

## 2016-01-27 DIAGNOSIS — Y939 Activity, unspecified: Secondary | ICD-10-CM | POA: Diagnosis not present

## 2016-01-27 DIAGNOSIS — Z79899 Other long term (current) drug therapy: Secondary | ICD-10-CM | POA: Diagnosis not present

## 2016-01-27 DIAGNOSIS — M25521 Pain in right elbow: Secondary | ICD-10-CM | POA: Diagnosis present

## 2016-01-27 DIAGNOSIS — I2510 Atherosclerotic heart disease of native coronary artery without angina pectoris: Secondary | ICD-10-CM | POA: Diagnosis not present

## 2016-01-27 DIAGNOSIS — N182 Chronic kidney disease, stage 2 (mild): Secondary | ICD-10-CM | POA: Diagnosis not present

## 2016-01-27 DIAGNOSIS — Z87891 Personal history of nicotine dependence: Secondary | ICD-10-CM | POA: Diagnosis not present

## 2016-01-27 NOTE — ED Triage Notes (Signed)
Golf ball sized swollen area to right elbow, painful, x 6 wks.

## 2016-01-27 NOTE — ED Provider Notes (Signed)
[facility] Emergency Department Provider Note  ____________________________________________  Time seen: Approximately 10:54 AM  I have reviewed the triage vital signs and the nursing notes.   HISTORY  Chief Complaint Joint Swelling    HPI Dylan Whitaker is a 60 y.o. male , NAD, presents to the emergency department with 6 week history of swollen painful right elbow. Patient states he has been seen by his primary care as well as orthopedic urgent care over the last 6 weeks for what he was told was bursitis. Was given 4 days of steroids which did not help. Went to orthopedic urgent care in [facility] this morning but was referred to the emergency department for drainage of the inflamed bursa. Denies any warmth, redness, skin sores. Has not had any oozing or weeping. No injury or trauma to the right upper extremity. Denies any numbness, weakness, tingling. Has full range of motion of the right upper extremity but pain with full extension at the elbow.No fever or chills.   Past Medical History:  Diagnosis Date  . CAD (coronary artery disease)    a. s/p multiple MIs; b. S/p CABG; b. s/p multiple PCIs  . CKD (chronic kidney disease), stage II   . Essential hypertension   . HLD (hyperlipidemia)   . Ischemic cardiomyopathy   . PAD (peripheral artery disease) (HCC)    a. s/p grafting and stenting  . Polysubstance abuse   . Stroke [facility])    a. in the setting of RLE graft repair    Patient Active Problem List   Diagnosis Date Noted  . Unstable angina (HCC) 10/08/2015  . CAD in native artery 10/08/2015  . History of substance abuse 10/08/2015  . Ischemic cardiomyopathy 10/08/2015  . Essential hypertension 10/08/2015  . HLD (hyperlipidemia) 10/08/2015  . PAD (peripheral artery disease) (HCC) 10/08/2015  . CKD (chronic kidney disease), stage II 10/08/2015  . NSTEMI (non-ST elevated myocardial infarction) (HCC) 10/08/2015    Past Surgical History:   Procedure Laterality Date  . CORONARY ARTERY BYPASS GRAFT    . FASCIOTOMY    . TOE AMPUTATION      Prior to Admission medications   Medication Sig Start Date End Date Taking? Authorizing Provider  atorvastatin (LIPITOR) 80 MG tablet Take 80 mg by mouth every evening.    Historical Provider, MD  enoxaparin (LOVENOX) 100 MG/ML injection Inject 90 mg into the skin every 12 (twelve) hours. Bridge 09/30/15 10/07/15  Historical Provider, MD  fenofibrate (TRICOR) 48 MG tablet Take 48 mg by mouth daily.    Historical Provider, MD  isosorbide mononitrate (IMDUR) 30 MG 24 hr tablet Take 30 mg by mouth daily.    Historical Provider, MD  LYRICA 100 MG capsule Take 200 mg by mouth at bedtime. 100mg  in the morning and 200mg  at bedtime    Historical Provider, MD  metoprolol succinate (TOPROL-XL) 50 MG 24 hr tablet Take 50 mg by mouth daily.    Historical Provider, MD  nitroGLYCERIN (NITROSTAT) 0.4 MG SL tablet Place 0.4 mg under the tongue every 5 (five) minutes as needed for chest pain.    Historical Provider, MD  nortriptyline (PAMELOR) 10 MG capsule Take 20 mg by mouth at bedtime.    Historical Provider, MD  oxyCODONE (OXY IR/ROXICODONE) 5 MG immediate release tablet Take 5 mg by mouth 5 (five) times daily.    Historical Provider, MD  pantoprazole (PROTONIX) 40 MG tablet Take 40 mg by mouth daily.    Historical Provider, MD  ranolazine (RANEXA) 500 MG 12 hr tablet Take 500 mg by mouth 2 (two) times daily.    Historical Provider, MD  senna-docusate (SENOKOT-S) 8.6-50 MG tablet Take 2 tablets by mouth 2 (two) times daily.    Historical Provider, MD  simethicone (MYLICON) 80 MG chewable tablet Chew 80 mg by mouth 4 (four) times daily as needed for flatulence.    Historical Provider, MD  warfarin (COUMADIN) 4 MG tablet Take 4 mg by mouth at bedtime.    Historical Provider, MD    Allergies Review of patient's allergies indicates no known allergies.  Family History  Problem Relation Age of Onset  . CAD  Father   . Hypertension Father   . Heart failure Father   . Heart disease Maternal Grandmother     Social History Social History  Substance Use Topics  . Smoking status: Former Smoker    Packs/day: 0.50    Years: 30.00    Types: Cigarettes    Quit date: 04/21/2015  . Smokeless tobacco: Never Used  . Alcohol use 8.4 oz/week    14 Standard drinks or equivalent per week     Review of Systems Constitutional: No fever/chills Musculoskeletal: Positive right elbow pain and swelling. No right forearm or upper arm pain.  Skin: Negative for rash, Redness, skin sores, abnormal warmth. Neurological: Negative for numbness, weakness, tingling.  ____________________________________________   PHYSICAL EXAM:  VITAL SIGNS: ED Triage Vitals [01/27/16 1043]  Enc Vitals Group     BP      Pulse      Resp      Temp      Temp src      SpO2      Weight 230 lb (104.3 kg)     Height 6' (1.829 m)     Head Circumference      Peak Flow      Pain Score 3     Pain Loc      Pain Edu?      Excl. in GC?      Constitutional: Alert and oriented. Well appearing and in no acute distress. Eyes: Conjunctivae are normal.   Head: Atraumatic. Cardiovascular: Good peripheral circulation with 2+ pulses noted in the right upper extremity. Respiratory: Normal respiratory effort without tachypnea or retractions.  Musculoskeletal: Right olecranon bursal swelling with effusion is noted. No tenderness to palpation. No abnormal warmth or skin sores. Full range of motion of the right elbow only with mild pain with full extension. Neurologic:  Normal speech and language. No gross focal neurologic deficits are appreciated.  Skin:  Skin is warm, dry and intact. No rash, redness, abnormal warmth, skin sores noted. Psychiatric: Mood and affect are normal. Speech and behavior are normal. Patient exhibits appropriate insight and judgement.   ____________________________________________    LABS  None ____________________________________________  EKG  None ____________________________________________  RADIOLOGY  None ____________________________________________    PROCEDURES  Procedure(s) performed: None   Procedures   Medications - No data to display   ____________________________________________   INITIAL IMPRESSION / ASSESSMENT AND PLAN / ED COURSE  Pertinent labs & imaging results that were available during my care of the patient were reviewed by me and considered in my medical decision making (see chart for details).  Clinical Course    Patient's diagnosis is consistent with Right olecranon bursitis. I called [facility] or so and the patient has an appointment scheduled with Altamese CabalMaurice Jones, PA-C at 1 PM today for further evaluation and treatment of bursitis. Patient is  given ED precautions to return to the ED for any worsening or new symptoms.    ____________________________________________  FINAL CLINICAL IMPRESSION(S) / ED DIAGNOSES  Final diagnoses:  Olecranon bursitis of right elbow      NEW MEDICATIONS STARTED DURING THIS VISIT:  Discharge Medication List as of 01/27/2016 11:10 AM           Hope Pigeon, PA-C 01/27/16 1119    Jeanmarie Plant, MD 01/27/16 1447

## 2016-03-15 ENCOUNTER — Observation Stay
Admission: EM | Admit: 2016-03-15 | Discharge: 2016-03-16 | Disposition: A | Discharge status: Home / Self Care | Payer: BLUE CROSS/BLUE SHIELD | Attending: Internal Medicine | Admitting: Internal Medicine

## 2016-03-15 ENCOUNTER — Emergency Department: Payer: BLUE CROSS/BLUE SHIELD

## 2016-03-15 ENCOUNTER — Encounter: Payer: Self-pay | Admitting: Emergency Medicine

## 2016-03-15 ENCOUNTER — Observation Stay: Payer: BLUE CROSS/BLUE SHIELD

## 2016-03-15 DIAGNOSIS — Z8249 Family history of ischemic heart disease and other diseases of the circulatory system: Secondary | ICD-10-CM | POA: Insufficient documentation

## 2016-03-15 DIAGNOSIS — J439 Emphysema, unspecified: Secondary | ICD-10-CM | POA: Diagnosis not present

## 2016-03-15 DIAGNOSIS — Z79891 Long term (current) use of opiate analgesic: Secondary | ICD-10-CM | POA: Insufficient documentation

## 2016-03-15 DIAGNOSIS — I25110 Atherosclerotic heart disease of native coronary artery with unstable angina pectoris: Secondary | ICD-10-CM | POA: Insufficient documentation

## 2016-03-15 DIAGNOSIS — I255 Ischemic cardiomyopathy: Secondary | ICD-10-CM | POA: Insufficient documentation

## 2016-03-15 DIAGNOSIS — Z87891 Personal history of nicotine dependence: Secondary | ICD-10-CM | POA: Insufficient documentation

## 2016-03-15 DIAGNOSIS — I739 Peripheral vascular disease, unspecified: Secondary | ICD-10-CM | POA: Insufficient documentation

## 2016-03-15 DIAGNOSIS — Z8673 Personal history of transient ischemic attack (TIA), and cerebral infarction without residual deficits: Secondary | ICD-10-CM | POA: Diagnosis not present

## 2016-03-15 DIAGNOSIS — Z955 Presence of coronary angioplasty implant and graft: Secondary | ICD-10-CM | POA: Insufficient documentation

## 2016-03-15 DIAGNOSIS — R072 Precordial pain: Secondary | ICD-10-CM

## 2016-03-15 DIAGNOSIS — R079 Chest pain, unspecified: Secondary | ICD-10-CM | POA: Diagnosis present

## 2016-03-15 DIAGNOSIS — Z7901 Long term (current) use of anticoagulants: Secondary | ICD-10-CM | POA: Diagnosis not present

## 2016-03-15 DIAGNOSIS — I130 Hypertensive heart and chronic kidney disease with heart failure and stage 1 through stage 4 chronic kidney disease, or unspecified chronic kidney disease: Principal | ICD-10-CM | POA: Insufficient documentation

## 2016-03-15 DIAGNOSIS — N182 Chronic kidney disease, stage 2 (mild): Secondary | ICD-10-CM | POA: Insufficient documentation

## 2016-03-15 DIAGNOSIS — E782 Mixed hyperlipidemia: Secondary | ICD-10-CM | POA: Diagnosis not present

## 2016-03-15 DIAGNOSIS — I5031 Acute diastolic (congestive) heart failure: Secondary | ICD-10-CM | POA: Diagnosis not present

## 2016-03-15 DIAGNOSIS — Z951 Presence of aortocoronary bypass graft: Secondary | ICD-10-CM | POA: Insufficient documentation

## 2016-03-15 DIAGNOSIS — R0602 Shortness of breath: Secondary | ICD-10-CM | POA: Diagnosis present

## 2016-03-15 DIAGNOSIS — I252 Old myocardial infarction: Secondary | ICD-10-CM | POA: Insufficient documentation

## 2016-03-15 DIAGNOSIS — Z89429 Acquired absence of other toe(s), unspecified side: Secondary | ICD-10-CM | POA: Diagnosis not present

## 2016-03-15 DIAGNOSIS — Z79899 Other long term (current) drug therapy: Secondary | ICD-10-CM | POA: Diagnosis not present

## 2016-03-15 HISTORY — DX: Disorder of kidney and ureter, unspecified: N28.9

## 2016-03-15 LAB — TROPONIN I
Troponin I: <0.03 ng/mL (ref <0.03)
Troponin I: <0.03 ng/mL (ref <0.03)
Troponin I: <0.03 ng/mL (ref <0.03)

## 2016-03-15 LAB — CBC
HEMATOCRIT: 45.7 % (ref 40.0–52.0)
HEMOGLOBIN: 16 g/dL (ref 13.0–18.0)
MCH: 35.7 pg — ABNORMAL HIGH (ref 26.0–34.0)
MCHC: 34.9 g/dL (ref 32.0–36.0)
MCV: 102.3 fL — ABNORMAL HIGH (ref 80.0–100.0)
Platelets: 152 10*3/uL (ref 150–440)
RBC: 4.47 MIL/uL (ref 4.40–5.90)
RDW: 13.9 % (ref 11.5–14.5)
WBC: 6 10*3/uL (ref 3.8–10.6)

## 2016-03-15 LAB — COMPREHENSIVE METABOLIC PANEL
ALBUMIN: 4.5 g/dL (ref 3.5–5.0)
ALK PHOS: 58 U/L (ref 38–126)
ALT: 24 U/L (ref 17–63)
ANION GAP: 8 (ref 5–15)
AST: 31 U/L (ref 15–41)
BILIRUBIN TOTAL: 0.6 mg/dL (ref 0.3–1.2)
BUN: 15 mg/dL (ref 6–20)
CALCIUM: 9.3 mg/dL (ref 8.9–10.3)
CO2: 26 mmol/L (ref 22–32)
Chloride: 105 mmol/L (ref 101–111)
Creatinine, Ser: 1.33 mg/dL — ABNORMAL HIGH (ref 0.61–1.24)
GFR calc Af Amer: >60 mL/min (ref >60)
GFR calc non Af Amer: 56 mL/min — ABNORMAL LOW (ref >60)
GLUCOSE: 136 mg/dL — AB (ref 65–99)
Potassium: 4.7 mmol/L (ref 3.5–5.1)
SODIUM: 139 mmol/L (ref 135–145)
Total Protein: 8 g/dL (ref 6.5–8.1)

## 2016-03-15 MED ORDER — METOPROLOL SUCCINATE ER 25 MG PO TB24
25.0000 mg | ORAL_TABLET | Freq: Every day | ORAL | Status: DC
  Administered 2016-03-16: 25 mg via ORAL
  Filled 2016-03-15: qty 1

## 2016-03-15 MED ORDER — IBUPROFEN 400 MG PO TABS
400.0000 mg | ORAL_TABLET | Freq: Once | ORAL | Status: AC
  Administered 2016-03-15: 400 mg via ORAL
  Filled 2016-03-15: qty 1

## 2016-03-15 MED ORDER — PREGABALIN 50 MG PO CAPS
100.0000 mg | ORAL_CAPSULE | Freq: Every day | ORAL | Status: DC
  Filled 2016-03-15: qty 2

## 2016-03-15 MED ORDER — ACETAMINOPHEN 325 MG PO TABS
650.0000 mg | ORAL_TABLET | ORAL | Status: DC | PRN
  Filled 2016-03-15: qty 2

## 2016-03-15 MED ORDER — NITROGLYCERIN 0.4 MG SL SUBL
0.4000 mg | SUBLINGUAL_TABLET | SUBLINGUAL | Status: DC | PRN
  Administered 2016-03-15 (×4): 0.4 mg via SUBLINGUAL
  Filled 2016-03-15 (×4): qty 1

## 2016-03-15 MED ORDER — ENOXAPARIN SODIUM 40 MG/0.4ML ~~LOC~~ SOLN
40.0000 mg | SUBCUTANEOUS | Status: DC
Start: 2016-03-15 — End: 2016-03-15

## 2016-03-15 MED ORDER — ATORVASTATIN CALCIUM 20 MG PO TABS: 80.0000 mg | ORAL_TABLET | Freq: Every evening | ORAL | Status: DC

## 2016-03-15 MED ORDER — NITROGLYCERIN 0.4 MG SL SUBL: 0.4000 mg | SUBLINGUAL_TABLET | SUBLINGUAL | Status: DC | PRN

## 2016-03-15 MED ORDER — FUROSEMIDE 10 MG/ML IJ SOLN
20.0000 mg | Freq: Two times a day (BID) | INTRAMUSCULAR | Status: DC
  Administered 2016-03-15: 20 mg via INTRAVENOUS
  Filled 2016-03-15: qty 2

## 2016-03-15 MED ORDER — NORTRIPTYLINE HCL 10 MG PO CAPS
20.0000 mg | ORAL_CAPSULE | Freq: Every day | ORAL | Status: DC
  Administered 2016-03-15: 20 mg via ORAL
  Filled 2016-03-15: qty 2

## 2016-03-15 MED ORDER — ASPIRIN 81 MG PO CHEW
324.0000 mg | CHEWABLE_TABLET | Freq: Once | ORAL | Status: AC
  Administered 2016-03-15: 324 mg via ORAL
  Filled 2016-03-15: qty 4

## 2016-03-15 MED ORDER — IOPAMIDOL (ISOVUE-370) INJECTION 76%
75.0000 mL | Freq: Once | INTRAVENOUS | Status: AC | PRN
  Administered 2016-03-15: 75 mL via INTRAVENOUS

## 2016-03-15 MED ORDER — PREGABALIN 75 MG PO CAPS
200.0000 mg | ORAL_CAPSULE | Freq: Every day | ORAL | Status: DC
  Administered 2016-03-15: 200 mg via ORAL
  Filled 2016-03-15: qty 1

## 2016-03-15 MED ORDER — LISINOPRIL 5 MG PO TABS
5.0000 mg | ORAL_TABLET | Freq: Every day | ORAL | Status: DC
  Administered 2016-03-16: 5 mg via ORAL
  Filled 2016-03-15: qty 1

## 2016-03-15 MED ORDER — MORPHINE SULFATE (PF) 4 MG/ML IV SOLN: 0.5000 mg | Freq: Once | INTRAVENOUS | Status: DC

## 2016-03-15 MED ORDER — FENOFIBRATE 54 MG PO TABS
54.0000 mg | ORAL_TABLET | Freq: Every day | ORAL | Status: DC
  Administered 2016-03-16: 54 mg via ORAL
  Filled 2016-03-15: qty 1

## 2016-03-15 MED ORDER — FUROSEMIDE 10 MG/ML IJ SOLN
20.0000 mg | Freq: Once | INTRAMUSCULAR | Status: AC
  Administered 2016-03-15: 20 mg via INTRAVENOUS
  Filled 2016-03-15: qty 2

## 2016-03-15 MED ORDER — IPRATROPIUM-ALBUTEROL 0.5-2.5 (3) MG/3ML IN SOLN: 3.0000 mL | Freq: Four times a day (QID) | RESPIRATORY_TRACT | Status: DC

## 2016-03-15 MED ORDER — OXYCODONE-ACETAMINOPHEN 5-325 MG PO TABS: 1.0000 | ORAL_TABLET | Freq: Four times a day (QID) | ORAL | Status: DC | PRN

## 2016-03-15 MED ORDER — OXYCODONE HCL 5 MG PO TABS
5.0000 mg | ORAL_TABLET | Freq: Every day | ORAL | Status: DC
  Administered 2016-03-15 – 2016-03-16 (×4): 5 mg via ORAL
  Filled 2016-03-15 (×4): qty 1

## 2016-03-15 MED ORDER — PANTOPRAZOLE SODIUM 40 MG PO TBEC
40.0000 mg | DELAYED_RELEASE_TABLET | Freq: Every day | ORAL | Status: DC
  Administered 2016-03-16: 40 mg via ORAL
  Filled 2016-03-15: qty 1

## 2016-03-15 MED ORDER — MORPHINE SULFATE (PF) 4 MG/ML IV SOLN
1.0000 mg | INTRAVENOUS | Status: DC | PRN
  Administered 2016-03-15: 1 mg via INTRAVENOUS
  Filled 2016-03-15: qty 1

## 2016-03-15 MED ORDER — MORPHINE SULFATE (PF) 2 MG/ML IV SOLN
INTRAVENOUS | Status: AC
  Administered 2016-03-15: 0.5 mg via INTRAVENOUS
  Filled 2016-03-15: qty 1

## 2016-03-15 MED ORDER — CLOPIDOGREL BISULFATE 75 MG PO TABS
75.0000 mg | ORAL_TABLET | Freq: Every day | ORAL | Status: DC
Start: 2016-03-16 — End: 2016-03-16
  Administered 2016-03-16: 75 mg via ORAL
  Filled 2016-03-15: qty 1

## 2016-03-15 MED ORDER — SIMETHICONE 80 MG PO CHEW: 80.0000 mg | CHEWABLE_TABLET | Freq: Four times a day (QID) | ORAL | Status: DC | PRN

## 2016-03-15 MED ORDER — RANOLAZINE ER 500 MG PO TB12
500.0000 mg | ORAL_TABLET | Freq: Two times a day (BID) | ORAL | Status: DC
  Administered 2016-03-15 – 2016-03-16 (×2): 500 mg via ORAL
  Filled 2016-03-15 (×2): qty 1

## 2016-03-15 MED ORDER — SENNOSIDES-DOCUSATE SODIUM 8.6-50 MG PO TABS
2.0000 | ORAL_TABLET | Freq: Two times a day (BID) | ORAL | Status: DC
  Administered 2016-03-15 – 2016-03-16 (×2): 2 via ORAL
  Filled 2016-03-15 (×2): qty 2

## 2016-03-15 MED ORDER — APIXABAN 5 MG PO TABS
5.0000 mg | ORAL_TABLET | Freq: Two times a day (BID) | ORAL | Status: DC
  Administered 2016-03-15 – 2016-03-16 (×2): 5 mg via ORAL
  Filled 2016-03-15 (×2): qty 1

## 2016-03-15 MED ORDER — ACETAMINOPHEN 325 MG PO TABS
650.0000 mg | ORAL_TABLET | Freq: Once | ORAL | Status: AC
  Administered 2016-03-15: 650 mg via ORAL
  Filled 2016-03-15: qty 2

## 2016-03-15 MED ORDER — ONDANSETRON HCL 4 MG/2ML IJ SOLN: 4.0000 mg | Freq: Four times a day (QID) | INTRAMUSCULAR | Status: DC | PRN

## 2016-03-15 MED ORDER — IPRATROPIUM-ALBUTEROL 0.5-2.5 (3) MG/3ML IN SOLN: 3.0000 mL | Freq: Four times a day (QID) | RESPIRATORY_TRACT | Status: DC | PRN

## 2016-03-15 NOTE — Progress Notes (Signed)
Patient complaining of chest pain and shortness of breath. 1 nitro given and Dr. Allena KatzPatel notified who stated to order a CT Angio to rule out a PE and give 0.5 ml of morphine.

## 2016-03-15 NOTE — ED Triage Notes (Signed)
Awoke with SOB this am, chest tightness.

## 2016-03-15 NOTE — ED Provider Notes (Signed)
[facility] Emergency Department Provider Note  ____________________________________________  Time seen: Approximately 9:18 AM  I have reviewed the triage vital signs and the nursing notes.   HISTORY  Chief Complaint Shortness of Breath    HPI Dylan Whitaker is a 60 y.o. male who complains of shortness of breath and chest tightness described as pressure in the left anterior chest that he noticed when he woke up this morning. He's been having worsening dyspnea on exertion over the past few days.back in June he came to the ED with chest pain, found to have a positive and rapidly increasing troponin. He was transferred to  [facility] who did a heart catheterization To open a large vessel occlusion but unable to stent it, and in the process the patient had a cardiac arrest.he reports compliance with his medications. Pain is nonradiating, associated with shortness of breath. No vomiting or diaphoresis.no aggravating or alleviating factors.     Past Medical History:  Diagnosis Date  . CAD (coronary artery disease)    a. s/p multiple MIs; b. S/p CABG; b. s/p multiple PCIs  . CKD (chronic kidney disease), stage II   . Essential hypertension   . HLD (hyperlipidemia)   . Ischemic cardiomyopathy   . PAD (peripheral artery disease) (HCC)    a. s/p grafting and stenting  . Polysubstance abuse   . Stroke [facility])    a. in the setting of RLE graft repair     Patient Active Problem List   Diagnosis Date Noted  . Unstable angina (HCC) 10/08/2015  . CAD in native artery 10/08/2015  . History of substance abuse 10/08/2015  . Ischemic cardiomyopathy 10/08/2015  . Essential hypertension 10/08/2015  . HLD (hyperlipidemia) 10/08/2015  . PAD (peripheral artery disease) (HCC) 10/08/2015  . CKD (chronic kidney disease), stage II 10/08/2015  . NSTEMI (non-ST elevated myocardial infarction) (HCC) 10/08/2015     Past Surgical History:  Procedure Laterality Date  . CORONARY ARTERY  BYPASS GRAFT    . FASCIOTOMY    . TOE AMPUTATION       Prior to Admission medications   Medication Sig Start Date End Date Taking? Authorizing Provider  atorvastatin (LIPITOR) 80 MG tablet Take 80 mg by mouth every evening.    Historical Provider, MD  enoxaparin (LOVENOX) 100 MG/ML injection Inject 90 mg into the skin every 12 (twelve) hours. Bridge 09/30/15 10/07/15  Historical Provider, MD  fenofibrate (TRICOR) 48 MG tablet Take 48 mg by mouth daily.    Historical Provider, MD  isosorbide mononitrate (IMDUR) 30 MG 24 hr tablet Take 30 mg by mouth daily.    Historical Provider, MD  LYRICA 100 MG capsule Take 200 mg by mouth at bedtime. 100mg  in the morning and 200mg  at bedtime    Historical Provider, MD  metoprolol succinate (TOPROL-XL) 50 MG 24 hr tablet Take 50 mg by mouth daily.    Historical Provider, MD  nitroGLYCERIN (NITROSTAT) 0.4 MG SL tablet Place 0.4 mg under the tongue every 5 (five) minutes as needed for chest pain.    Historical Provider, MD  nortriptyline (PAMELOR) 10 MG capsule Take 20 mg by mouth at bedtime.    Historical Provider, MD  oxyCODONE (OXY IR/ROXICODONE) 5 MG immediate release tablet Take 5 mg by mouth 5 (five) times daily.    Historical Provider, MD  pantoprazole (PROTONIX) 40 MG tablet Take 40 mg by mouth daily.    Historical Provider, MD  ranolazine (RANEXA) 500 MG 12 hr tablet Take 500 mg by  mouth 2 (two) times daily.    Historical Provider, MD  senna-docusate (SENOKOT-S) 8.6-50 MG tablet Take 2 tablets by mouth 2 (two) times daily.    Historical Provider, MD  simethicone (MYLICON) 80 MG chewable tablet Chew 80 mg by mouth 4 (four) times daily as needed for flatulence.    Historical Provider, MD  warfarin (COUMADIN) 4 MG tablet Take 4 mg by mouth at bedtime.    Historical Provider, MD     Allergies Patient has no known allergies.   Family History  Problem Relation Age of Onset  . CAD Father   . Hypertension Father   . Heart failure Father   . Heart  disease Maternal Grandmother     Social History Social History  Substance Use Topics  . Smoking status: Former Smoker    Packs/day: 0.50    Years: 30.00    Types: Cigarettes    Quit date: 04/21/2015  . Smokeless tobacco: Never Used  . Alcohol use 8.4 oz/week    14 Standard drinks or equivalent per week    Review of Systems  Constitutional:   No fever or chills.  ENT:   No sore throat. No rhinorrhea. Cardiovascular:   Positive as above chest pain. Respiratory:   Positive shortness of breath. Gastrointestinal:   Negative for abdominal pain, vomiting and diarrhea.  Genitourinary:   Negative for dysuria or difficulty urinating. Musculoskeletal:   Negative for focal pain or swelling Neurological:   Positive for headaches 10-point ROS otherwise negative.  ____________________________________________   PHYSICAL EXAM:  VITAL SIGNS: ED Triage Vitals  Enc Vitals Group     BP 03/15/16 0803 (!) 149/69     Pulse Rate 03/15/16 0803 86     Resp 03/15/16 0803 16     Temp 03/15/16 0803 97.5 F (36.4 C)     Temp Source 03/15/16 0803 Oral     SpO2 03/15/16 0803 98 %     Weight 03/15/16 0804 235 lb (106.6 kg)     Height 03/15/16 0804 6' (1.829 m)     Head Circumference --      Peak Flow --      Pain Score 03/15/16 0839 2     Pain Loc --      Pain Edu? --      Excl. in GC? --     Vital signs reviewed, nursing assessments reviewed.   Constitutional:   Alert and oriented. Well appearing and in no distress. Eyes:   No scleral icterus. No conjunctival pallor. PERRL. EOMI.  No nystagmus. ENT   Head:   Normocephalic and atraumatic.   Nose:   No congestion/rhinnorhea. No septal hematoma   Mouth/Throat:   MMM, no pharyngeal erythema. No peritonsillar mass.    Neck:   No stridor. No SubQ emphysema. No meningismus. Hematological/Lymphatic/Immunilogical:   No cervical lymphadenopathy. Cardiovascular:   RRR. Symmetric bilateral radial and DP pulses.  No murmurs.   Respiratory:   Normal respiratory effort without tachypnea nor retractions.by basilar crackles. Gastrointestinal:   Soft and nontender. Non distended. There is no CVA tenderness.  No rebound, rigidity, or guarding. Genitourinary:   deferred Musculoskeletal:   Nontender with normal range of motion in all extremities. No joint effusions.  No lower extremity tenderness.  No edema. Neurologic:   Normal speech and language.  CN 2-10 normal. Motor grossly intact. No gross focal neurologic deficits are appreciated.  Skin:    Skin is warm, dry and intact. No rash noted.  No petechiae, purpura, or  bullae.  ____________________________________________    LABS (pertinent positives/negatives) (all labs ordered are listed, but only abnormal results are displayed) Labs Reviewed  CBC - Abnormal; Notable for the following:       Result Value   MCV 102.3 (*)    MCH 35.7 (*)    All other components within normal limits  COMPREHENSIVE METABOLIC PANEL - Abnormal; Notable for the following:    Glucose, Bld 136 (*)    Creatinine, Ser 1.33 (*)    GFR calc non Af Amer 56 (*)    All other components within normal limits  TROPONIN I   ____________________________________________   EKG  Interpreted by me Normal sinus rhythm rate of 86, normal axis and intervals. Normal QRS ST segments and T waves.  ____________________________________________    RADIOLOGY  Chest x-ray with pulmonary vascular congestion. No pneumothorax or consolidation.  ____________________________________________   PROCEDURES Procedures  ____________________________________________   INITIAL IMPRESSION / ASSESSMENT AND PLAN / ED COURSE  Pertinent labs & imaging results that were available during my care of the patient were reviewed by me and considered in my medical decision making (see chart for details).  Patient presents with precordial chest pain. Patient has severe CAD and vasculopathy and is still smoking. At  elevated risk for ACS although his presentation is not currently consistent with an STEMI or unstable angina. Patient ordered for aspirin and nitroglycerin which may help with some mild CHF symptoms as well. Case discussed with the hospitalist for further evaluation.     Clinical Course    ____________________________________________   FINAL CLINICAL IMPRESSION(S) / ED DIAGNOSES  Final diagnoses:  Precordial chest pain       Portions of this note were generated with dragon dictation software. Dictation errors may occur despite best attempts at proofreading.    Sharman CheekPhillip Jehan Ranganathan, MD 03/15/16 [phone]

## 2016-03-15 NOTE — H&P (Signed)
[facility] - [place] at [facility]   PATIENT NAME: Dylan GlazeGary Whitaker    MR#:  [id number]  DATE OF BIRTH:  [birth year]  DATE OF ADMISSION:  03/15/2016  PRIMARY CARE PHYSICIAN: Janetta HoraSHARP-DALE, DEBORAH ANNE, PA   REQUESTING/REFERRING PHYSICIAN: Dr Vear ClockPhillips  CHIEF COMPLAINT:  Increasing SOB and chest pressure on and off since 5 5 AM.  HISTORY OF PRESENT ILLNESS:  Dylan GlazeGary Whitaker  is a 60 y.o. male with a known history ofComplex coronary artery disease with multiple MIs and multiple PCI's and status post CABG who had most recent cardiac catheter done in June 2017 underwent cardiac arrest and has been following up with do cardiology consulted the emergency room today with waking up at 5 AM with chest pressure and shortness of breath. Patient received nitroglycerin sublingual in the emergency room. He is having some lingering chest pressure. Troponin is 0.03. EKG does not show acute changes. He is currently be admitted for chest pain rule out.  PAST MEDICAL HISTORY:   Past Medical History:  Diagnosis Date  . CAD (coronary artery disease)    a. s/p multiple MIs; b. S/p CABG; b. s/p multiple PCIs  . CKD (chronic kidney disease), stage II   . Essential hypertension   . HLD (hyperlipidemia)   . Ischemic cardiomyopathy   . PAD (peripheral artery disease) (HCC)    a. s/p grafting and stenting  . Polysubstance abuse   . Stroke [facility](HCC)    a. in the setting of RLE graft repair    PAST SURGICAL HISTOIRY:   Past Surgical History:  Procedure Laterality Date  . CORONARY ARTERY BYPASS GRAFT    . FASCIOTOMY    . TOE AMPUTATION      SOCIAL HISTORY:   Social History  Substance Use Topics  . Smoking status: Former Smoker    Packs/day: 0.50    Years: 30.00    Types: Cigarettes    Quit date: 04/21/2015  . Smokeless tobacco: Never Used  . Alcohol use 8.4 oz/week    14 Standard drinks or equivalent per week    FAMILY HISTORY:   Family History  Problem Relation Age of Onset   . CAD Father   . Hypertension Father   . Heart failure Father   . Heart disease Maternal Grandmother     DRUG ALLERGIES:  No Known Allergies  REVIEW OF SYSTEMS:  Review of Systems  Unable to perform ROS: Dementia  Constitutional: Negative for chills, fever and weight loss.  HENT: Negative for ear discharge, ear pain and nosebleeds.   Eyes: Negative for blurred vision, pain and discharge.  Respiratory: Positive for shortness of breath. Negative for sputum production, wheezing and stridor.   Cardiovascular: Positive for chest pain. Negative for palpitations, orthopnea and PND.  Gastrointestinal: Negative for abdominal pain, diarrhea, nausea and vomiting.  Genitourinary: Negative for frequency and urgency.  Musculoskeletal: Negative for back pain and joint pain.  Neurological: Negative for sensory change, speech change, focal weakness and weakness.  Psychiatric/Behavioral: Negative for depression and hallucinations. The patient is not nervous/anxious.      MEDICATIONS AT HOME:   Prior to Admission medications   Medication Sig Start Date End Date Taking? Authorizing Provider  apixaban (ELIQUIS) 5 MG TABS tablet Take 5 mg by mouth 2 (two) times daily.   Yes Historical Provider, MD  atorvastatin (LIPITOR) 80 MG tablet Take 80 mg by mouth every evening.   Yes Historical Provider, MD  clopidogrel (PLAVIX) 75 MG tablet Take 75 mg by mouth  daily.   Yes Historical Provider, MD  fenofibrate (TRICOR) 48 MG tablet Take 48 mg by mouth daily.   Yes Historical Provider, MD  lisinopril (PRINIVIL,ZESTRIL) 5 MG tablet Take 5 mg by mouth daily.   Yes Historical Provider, MD  LYRICA 100 MG capsule Take 200 mg by mouth at bedtime. 100mg  in the afternoon and 200mg  at bedtime   Yes Historical Provider, MD  metoprolol succinate (TOPROL-XL) 25 MG 24 hr tablet Take 25 mg by mouth daily.    Yes Historical Provider, MD  nitroGLYCERIN (NITROSTAT) 0.4 MG SL tablet Place 0.4 mg under the tongue every 5 (five)  minutes as needed for chest pain.   Yes Historical Provider, MD  nortriptyline (PAMELOR) 10 MG capsule Take 20 mg by mouth at bedtime.   Yes Historical Provider, MD  oxyCODONE (OXY IR/ROXICODONE) 5 MG immediate release tablet Take 5 mg by mouth 5 (five) times daily.   Yes Historical Provider, MD  pantoprazole (PROTONIX) 40 MG tablet Take 40 mg by mouth daily.   Yes Historical Provider, MD  ranolazine (RANEXA) 500 MG 12 hr tablet Take 500 mg by mouth 2 (two) times daily.   Yes Historical Provider, MD  senna-docusate (SENOKOT-S) 8.6-50 MG tablet Take 2 tablets by mouth 2 (two) times daily.   Yes Historical Provider, MD  simethicone (MYLICON) 80 MG chewable tablet Chew 80 mg by mouth 4 (four) times daily as needed for flatulence.   Yes Historical Provider, MD      VITAL SIGNS:  Blood pressure 130/79, pulse 79, temperature 97.5 F (36.4 C), temperature source Oral, resp. rate 19, height 6' (1.829 m), weight 106.6 kg (235 lb), SpO2 95 %.  PHYSICAL EXAMINATION:  GENERAL:  60 y.o.-year-old patient lying in the bed with no acute distress.  EYES: Pupils equal, round, reactive to light and accommodation. No scleral icterus. Extraocular muscles intact.  HEENT: Head atraumatic, normocephalic. Oropharynx and nasopharynx clear.  NECK:  Supple, no jugular venous distention. No thyroid enlargement, no tenderness.  LUNGS: Normal breath sounds bilaterally, no wheezing, rales,rhonchi or crepitation. No use of accessory muscles of respiration.  CARDIOVASCULAR: S1, S2 normal. No murmurs, rubs, or gallops.  ABDOMEN: Soft, nontender, nondistended. Bowel sounds present. No organomegaly or mass.  EXTREMITIES: No pedal edema, cyanosis, or clubbing.  NEUROLOGIC: Cranial nerves II through XII are intact. Muscle strength 5/5 in all extremities. Sensation intact. Gait not checked.  PSYCHIATRIC: The patient is alert and oriented x 3.  SKIN: No obvious rash, lesion, or ulcer.   LABORATORY PANEL:   CBC  Recent  Labs Lab 03/15/16 0837  WBC 6.0  HGB 16.0  HCT 45.7  PLT 152   ------------------------------------------------------------------------------------------------------------------  Chemistries   Recent Labs Lab 03/15/16 0837  NA 139  K 4.7  CL 105  CO2 26  GLUCOSE 136*  BUN 15  CREATININE 1.33*  CALCIUM 9.3  AST 31  ALT 24  ALKPHOS 58  BILITOT 0.6   ------------------------------------------------------------------------------------------------------------------  Cardiac Enzymes  Recent Labs Lab 03/15/16 0837  TROPONINI <0.03   ------------------------------------------------------------------------------------------------------------------  RADIOLOGY:  Dg Chest Portable 1 View  Result Date: 03/15/2016 CLINICAL DATA:  Shortness of breath and chest tightness. EXAM: PORTABLE CHEST 1 VIEW COMPARISON:  Chest CTA and radiograph 10/08/2015 FINDINGS: Sequelae of prior CABG are again identified. The cardiomediastinal silhouette is unchanged and within normal limits. There is new pulmonary vascular congestion without overt alveolar edema. No segmental airspace consolidation, pleural effusion, or pneumothorax is identified. No acute osseous abnormality is seen. IMPRESSION: Mild pulmonary vascular  congestion. Electronically Signed   By: Sebastian AcheAllen  Grady M.D.   On: 03/15/2016 08:30    EKG:   Normal sinus rhythm. Q waves in anterior inferior leads. Appears old. IMPRESSION AND PLAN:   Dylan Whitaker is a 60 y.o. male who complains of shortness of breath and chest tightness described as pressure in the left anterior chest that he noticed when he woke up this morning. He's been having worsening dyspnea on exertion over the past few days.he reports compliance with his medications. Pain is nonradiating, associated with shortness of breath  1. Chest pain rule out MI -Patient has a complex coronary artery disease with multiple PCI's, multiple MI with most recent cardiac arrest in June 2017  when he underwent a cardiac cath showed stent in the left circumflex was occluded not amenable for stenting. -Continue cardiac meds -Cardiology consultation -Cycle cardiac enzymes 3 -Further workup according to cardiology recommendation  2. Tobacco abuse -Advised smoking cessation 4 minutes spent  3. Peripheral arterial disease -Patient on oral anticoagulation-chronic  4. Ischemic cardiomyopathy -Most recent ejection fraction 55% per echo in June 2017  5. Hyperlipidemia continue statins  6. DVT prophylaxis subcutaneous Lovenox  All the records are reviewed and case discussed with ED provider. Management plans discussed with the patient, family and they are in agreement.  CODE STATUS: full TOTAL TIME TAKING CARE OF THIS PATIENT: 50 minutes.    Toree Edling M.D on 03/15/2016 at 10:09 AM  Between 7am to 6pm - Pager - [phone]  After 6pm go to [email or web address] - password [email or web address] [facility]  [facility]  Office  [phone]  CC: Primary care physician; Janetta HoraSHARP-DALE, DEBORAH ANNE, PA

## 2016-03-16 MED ORDER — MORPHINE SULFATE (PF) 2 MG/ML IV SOLN
INTRAVENOUS | Status: AC
Start: 2016-03-16 — End: 2016-03-16
  Administered 2016-03-16: 1 mg via INTRAMUSCULAR
  Filled 2016-03-16: qty 1

## 2016-03-16 MED ORDER — FUROSEMIDE 20 MG PO TABS: 20.0000 mg | ORAL_TABLET | ORAL | 0 refills | Status: AC

## 2016-03-16 MED ORDER — FUROSEMIDE 20 MG PO TABS
20.0000 mg | ORAL_TABLET | ORAL | Status: DC
  Administered 2016-03-16: 20 mg via ORAL
  Filled 2016-03-16: qty 1

## 2016-03-16 MED ORDER — IPRATROPIUM-ALBUTEROL 20-100 MCG/ACT IN AERS: 1.0000 | INHALATION_SPRAY | Freq: Four times a day (QID) | RESPIRATORY_TRACT | Status: DC | PRN

## 2016-03-16 MED ORDER — IPRATROPIUM-ALBUTEROL 20-100 MCG/ACT IN AERS: 1.0000 | INHALATION_SPRAY | Freq: Four times a day (QID) | RESPIRATORY_TRACT | 1 refills | Status: AC | PRN

## 2016-03-16 NOTE — Progress Notes (Signed)
Discharge: Pt d/c from room, walked out on his own, he is driving his self home per pt. Discharge instructions given to the patient.  No questions from pt, reintegrated to the pt to call or go to the ED for chest discomfort. Pt dressed in street clothes and left with discharge papers and prescriptions sent to his local pharmacy. IV d/ced, tele removed and no complaints of pain or discomfort.

## 2016-03-16 NOTE — Progress Notes (Signed)
Pt alert and oriented x4, no complaints of pain or discomfort.  Bed in low position, call bell within reach.  Bed alarms on and functioning.  Assessment done and charted.  Will continue to monitor and do hourly rounding throughout the shift 

## 2016-03-16 NOTE — Discharge Instructions (Signed)
[facility]

## 2016-03-16 NOTE — Discharge Summary (Signed)
[facility] - [place] [facility]   PATIENT NAME: Stacie GlazeGary Insley    MR#:  [id number]  DATE OF BIRTH:  December 16, 1955  DATE OF ADMISSION:  03/15/2016 ADMITTING PHYSICIAN: Enedina FinnerSona Yarah Fuente, MD  DATE OF DISCHARGE: 03/16/16  PRIMARY CARE PHYSICIAN: Janetta HoraSHARP-DALE, DEBORAH ANNE, PA    ADMISSION DIAGNOSIS:  Precordial chest pain [R07.2]  DISCHARGE DIAGNOSIS:  Acute mild CHF,daistolic Severe CAD  Ongoing tobacco abuse with COPD flare-mild HTN  SECONDARY DIAGNOSIS:   Past Medical History:  Diagnosis Date  . CAD (coronary artery disease)    a. s/p multiple MIs; b. S/p CABG; b. s/p multiple PCIs  . CKD (chronic kidney disease), stage II   . Essential hypertension   . HLD (hyperlipidemia)   . Ischemic cardiomyopathy   . PAD (peripheral artery disease) (HCC)    a. s/p grafting and stenting  . Polysubstance abuse   . Renal insufficiency   . Stroke [facility](HCC)    a. in the setting of RLE graft repair    HOSPITAL COURSE:   Osie BondGary D Chavisis a 60 y.o.malewho complains of shortness of breath and chest tightness described as pressure in the left anterior chest that he noticed when he woke up this morning. He's been having worsening dyspnea on exertion over the past few days.he reports compliance with his medications. Pain is nonradiating, associated with shortness of breath  1. Chest pain rule out MI -Patient has a complex coronary artery disease with multiple PCI's, multiple MI with most recent cardiac arrest in June 2017 when he underwent a cardiac cath showed stent in the left circumflex was occluded not amenable for stenting. -Continue cardiac meds -Cardiology consultation appreciated -Cycle cardiac enzymes 3 negative, no acute EKG changes  2. Tobacco abuse wiith CXR and CT evidence of emphysema -combivent oral inhaler -Advised smoking cessation 4 minutes spent  3. Peripheral arterial disease -Patient on oral anticoagulation-chronic  4. Ischemic cardiomyopathy  with mild acute diastolic CHF -Most recent ejection fraction 55% per echo in June 2017 -gave IV lasix and now 20 mg qod for 5 doses  5. Hyperlipidemia continue statins  6. DVT prophylaxis subcutaneous Lovenox  Overall stable No further w/u per cardiology Pt to f/u [facility] cardiology  D/c home  CONSULTS OBTAINED:  Treatment Team:  Lamar BlinksBruce J Kowalski, MD  DRUG ALLERGIES:  No Known Allergies  DISCHARGE MEDICATIONS:   Current Discharge Medication List    START taking these medications   Details  furosemide (LASIX) 20 MG tablet Take 1 tablet (20 mg total) by mouth every other day. Qty: 5 tablet, Refills: 0    Ipratropium-Albuterol (COMBIVENT) 20-100 MCG/ACT AERS respimat Inhale 1 puff into the lungs every 6 (six) hours as needed for wheezing. Qty: 1 Inhaler, Refills: 1      CONTINUE these medications which have NOT CHANGED   Details  apixaban (ELIQUIS) 5 MG TABS tablet Take 5 mg by mouth 2 (two) times daily.    atorvastatin (LIPITOR) 80 MG tablet Take 80 mg by mouth every evening.    clopidogrel (PLAVIX) 75 MG tablet Take 75 mg by mouth daily.    fenofibrate (TRICOR) 48 MG tablet Take 48 mg by mouth daily.    lisinopril (PRINIVIL,ZESTRIL) 5 MG tablet Take 5 mg by mouth daily.    LYRICA 100 MG capsule Take 200 mg by mouth at bedtime. 100mg  in the afternoon and 200mg  at bedtime    metoprolol succinate (TOPROL-XL) 25 MG 24 hr tablet Take 25 mg by mouth daily.  nitroGLYCERIN (NITROSTAT) 0.4 MG SL tablet Place 0.4 mg under the tongue every 5 (five) minutes as needed for chest pain.    nortriptyline (PAMELOR) 10 MG capsule Take 20 mg by mouth at bedtime.    oxyCODONE (OXY IR/ROXICODONE) 5 MG immediate release tablet Take 5 mg by mouth 5 (five) times daily.    pantoprazole (PROTONIX) 40 MG tablet Take 40 mg by mouth daily.    ranolazine (RANEXA) 500 MG 12 hr tablet Take 500 mg by mouth 2 (two) times daily.    senna-docusate (SENOKOT-S) 8.6-50 MG tablet Take 2 tablets  by mouth 2 (two) times daily.    simethicone (MYLICON) 80 MG chewable tablet Chew 80 mg by mouth 4 (four) times daily as needed for flatulence.        If you experience worsening of your admission symptoms, develop shortness of breath, life threatening emergency, suicidal or homicidal thoughts you must seek medical attention immediately by calling 911 or calling your MD immediately  if symptoms less severe.  You Must read complete instructions/literature along with all the possible adverse reactions/side effects for all the Medicines you take and that have been prescribed to you. Take any new Medicines after you have completely understood and accept all the possible adverse reactions/side effects.   Please note  You were cared for by a hospitalist during your hospital stay. If you have any questions about your discharge medications or the care you received while you were in the hospital after you are discharged, you can call the unit and asked to speak with the hospitalist on call if the hospitalist that took care of you is not available. Once you are discharged, your primary care physician will handle any further medical issues. Please note that NO REFILLS for any discharge medications will be authorized once you are discharged, as it is imperative that you return to your primary care physician (or establish a relationship with a primary care physician if you do not have one) for your aftercare needs so that they can reassess your need for medications and monitor your lab values. Today   SUBJECTIVE   Doing well  VITAL SIGNS:  Blood pressure 123/70, pulse 76, temperature 97.8 F (36.6 C), temperature source Oral, resp. rate 18, height 6' (1.829 m), weight 106.6 kg (235 lb), SpO2 96 %.  I/O:   Intake/Output Summary (Last 24 hours) at 03/16/16 0813 Last data filed at 03/16/16 0116  Gross per 24 hour  Intake                0 ml  Output             1875 ml  Net            -1875 ml     PHYSICAL EXAMINATION:  GENERAL:  60 y.o.-year-old patient lying in the bed with no acute distress.  EYES: Pupils equal, round, reactive to light and accommodation. No scleral icterus. Extraocular muscles intact.  HEENT: Head atraumatic, normocephalic. Oropharynx and nasopharynx clear.  NECK:  Supple, no jugular venous distention. No thyroid enlargement, no tenderness.  LUNGS: Normal breath sounds bilaterally, no wheezing, rales,rhonchi or crepitation. No use of accessory muscles of respiration.  CARDIOVASCULAR: S1, S2 normal. No murmurs, rubs, or gallops.  ABDOMEN: Soft, non-tender, non-distended. Bowel sounds present. No organomegaly or mass.  EXTREMITIES: No pedal edema, cyanosis, or clubbing.  NEUROLOGIC: Cranial nerves II through XII are intact. Muscle strength 5/5 in all extremities. Sensation intact. Gait not checked.  PSYCHIATRIC: The  patient is alert and oriented x 3.  SKIN: No obvious rash, lesion, or ulcer.   DATA REVIEW:   CBC   Recent Labs Lab 03/15/16 0837  WBC 6.0  HGB 16.0  HCT 45.7  PLT 152    Chemistries   Recent Labs Lab 03/15/16 0837  NA 139  K 4.7  CL 105  CO2 26  GLUCOSE 136*  BUN 15  CREATININE 1.33*  CALCIUM 9.3  AST 31  ALT 24  ALKPHOS 58  BILITOT 0.6    Microbiology Results   No results found for this or any previous visit (from the past 240 hour(s)).  RADIOLOGY:  Ct Angio Chest Pe W Or Wo Contrast  Result Date: 03/15/2016 CLINICAL DATA:  Chest pain and shortness of breath starting this morning. Coronary artery disease with multiple stents. EXAM: CT ANGIOGRAPHY CHEST WITH CONTRAST TECHNIQUE: Multidetector CT imaging of the chest was performed using the standard protocol during bolus administration of intravenous contrast. Multiplanar CT image reconstructions and MIPs were obtained to evaluate the vascular anatomy. CONTRAST:  75 cc of Isovue 370. COMPARISON:  Plain films earlier today.  CT 10/08/2015. FINDINGS: Cardiovascular: The  quality of this exam for evaluation of pulmonary embolism is moderate to good primary limitation is of motion, as detailed below. No pulmonary embolism to the segmental level Prior median sternotomy. Aortic and branch vessel atherosclerosis. Aorta not opacified. Therefore dissection cannot be evaluated/excluded. Native coronary artery atherosclerosis. Mediastinum/Nodes: Subcarinal node measures 13 mm on image 47/series 4 and is enlarged since the prior exam. Calcified mediastinal and bilateral hilar nodes are consistent with old granulomatous disease. No hilar adenopathy. Esophageal air-fluid level on image 52/series 4. Lungs/Pleura: Trace right. Pleural fluid. Minimal motion degradation. Lower lobe predominant bronchial wall thickening. Mild centrilobular emphysema probable scarring at the right apex, including on image 17/series 6. Mild pulmonary venous congestion as evidenced by septal thickening and patchy ground-glass opacity. This is lower lobe and slightly right-sided predominant. Upper Abdomen: Too small to characterize right hepatic lobe low-density lesion. Normal imaged portions of the spleen, stomach, pancreas, adrenal glands, kidneys. Musculoskeletal: Moderate thoracic spondylosis. Review of the MIP images confirms the above findings. IMPRESSION: 1. Minimally motion degraded exam. Given this factor, no evidence of pulmonary embolism. 2. Mild interstitial edema, as evidenced by a screw septal thickening patchy ground-glass opacity. 3. Trace right pleural fluid. 4. Borderline subcarinal adenopathy is likely reactive and could relate to fluid overload. Recommend attention on follow-up. 5. Probable scarring in the right apex. Consider follow-up chest CT at 3-6 months to confirm stability. 6. Esophageal air fluid level suggests dysmotility or gastroesophageal reflux. Electronically Signed   By: Jeronimo Greaves M.D.   On: 03/15/2016 17:46   Dg Chest Portable 1 View  Result Date: 03/15/2016 CLINICAL DATA:   Shortness of breath and chest tightness. EXAM: PORTABLE CHEST 1 VIEW COMPARISON:  Chest CTA and radiograph 10/08/2015 FINDINGS: Sequelae of prior CABG are again identified. The cardiomediastinal silhouette is unchanged and within normal limits. There is new pulmonary vascular congestion without overt alveolar edema. No segmental airspace consolidation, pleural effusion, or pneumothorax is identified. No acute osseous abnormality is seen. IMPRESSION: Mild pulmonary vascular congestion. Electronically Signed   By: Sebastian Ache M.D.   On: 03/15/2016 08:30     Management plans discussed with the patient, family and they are in agreement.  CODE STATUS:     Code Status Orders        Start     Ordered   03/15/16 1217  Full code  Continuous     03/15/16 1216    Code Status History    Date Active Date Inactive Code Status Order ID Comments User Context   10/08/2015  7:02 AM 10/09/2015  6:30 AM Full Code [id number]  Gracelyn NurseJohn D Johnston, MD ED      TOTAL TIME TAKING CARE OF THIS PATIENT: 40 minutes.    Johnae Friley M.D on 03/16/2016 at 8:13 AM  Between 7am to 6pm - Pager - [phone] After 6pm go to [email or web address] - password [email or web address] [facility]  [facility]  Office  [phone]  CC: Primary care physician; Janetta HoraSHARP-DALE, DEBORAH ANNE, PA

## 2016-03-16 NOTE — Consult Note (Signed)
[facility] Consultation Note  Patient ID: [facility], MRN: [id number], DOB/AGE: 62-Apr-1957 62 y.o. Admit date: 03/15/2016   Date of Consult: 03/16/2016 Primary Physician: Janetta Hora, PA Primary Cardiologist: Gwen Pounds  Chief Complaint:  Chief Complaint  Patient presents with  . Shortness of Breath   Reason for Consult: chest pain with known cardiovascular disease  HPI: 60 y.o. male with known coronary artery disease status post coronary artery bypass graft and multiple PCI and stent placement with recent episode of chest discomfort and other abnormalities back in June of this year with a subendocardial myocardial infarction. The patient did have a balloon angioplasty without stenting. The patient since then has been placed on appropriate medication management including high intensity cholesterol therapy Plavix and aspirin ACE inhibitor and beta blocker. The patient also has had atypical chest pain for which Ranexa has been used. Now he is admitted for further significant concerns of left chest discomfort waxing and waning with and without activity but mainly at rest and nonradiating. It is not associated with any shortness of breath at this time. The patient did have an EKG showing normal sinus rhythm with inferior infarct age and determined with nausea ST and T-wave changes. Troponin was normal without evidence of cardial infarction. The patient has been ambulating and doing fairly well with no further episodes of significant chest discomfort or other anginal symptoms  Past Medical History:  Diagnosis Date  . CAD (coronary artery disease)    a. s/p multiple MIs; b. S/p CABG; b. s/p multiple PCIs  . CKD (chronic kidney disease), stage II   . Essential hypertension   . HLD (hyperlipidemia)   . Ischemic cardiomyopathy   . PAD (peripheral artery disease) (HCC)    a. s/p grafting and stenting  . Polysubstance abuse   . Renal insufficiency   . Stroke [facility])    a. in the setting of RLE graft repair      Surgical History:  Past Surgical History:  Procedure Laterality Date  . CORONARY ARTERY BYPASS GRAFT    . FASCIOTOMY    . TOE AMPUTATION       Home Meds: Prior to Admission medications   Medication Sig Start Date End Date Taking? Authorizing Provider  apixaban (ELIQUIS) 5 MG TABS tablet Take 5 mg by mouth 2 (two) times daily.   Yes Historical Provider, MD  atorvastatin (LIPITOR) 80 MG tablet Take 80 mg by mouth every evening.   Yes Historical Provider, MD  clopidogrel (PLAVIX) 75 MG tablet Take 75 mg by mouth daily.   Yes Historical Provider, MD  fenofibrate (TRICOR) 48 MG tablet Take 48 mg by mouth daily.   Yes Historical Provider, MD  lisinopril (PRINIVIL,ZESTRIL) 5 MG tablet Take 5 mg by mouth daily.   Yes Historical Provider, MD  LYRICA 100 MG capsule Take 200 mg by mouth at bedtime. 100mg  in the afternoon and 200mg  at bedtime   Yes Historical Provider, MD  metoprolol succinate (TOPROL-XL) 25 MG 24 hr tablet Take 25 mg by mouth daily.    Yes Historical Provider, MD  nitroGLYCERIN (NITROSTAT) 0.4 MG SL tablet Place 0.4 mg under the tongue every 5 (five) minutes as needed for chest pain.   Yes Historical Provider, MD  nortriptyline (PAMELOR) 10 MG capsule Take 20 mg by mouth at bedtime.   Yes Historical Provider, MD  oxyCODONE (OXY IR/ROXICODONE) 5 MG immediate release tablet Take 5 mg by mouth 5 (five) times daily.   Yes Historical Provider, MD  pantoprazole (PROTONIX) 40 MG tablet Take 40 mg by mouth daily.   Yes Historical Provider, MD  ranolazine (RANEXA) 500 MG 12 hr tablet Take 500 mg by mouth 2 (two) times daily.   Yes Historical Provider, MD  senna-docusate (SENOKOT-S) 8.6-50 MG tablet Take 2 tablets by mouth 2 (two) times daily.   Yes Historical Provider, MD  simethicone (MYLICON) 80 MG chewable tablet Chew 80 mg by mouth 4 (four) times daily as needed for flatulence.   Yes Historical Provider, MD  furosemide (LASIX) 20 MG tablet Take  1 tablet (20 mg total) by mouth every other day. 03/16/16   Enedina Finner, MD  Ipratropium-Albuterol (COMBIVENT) 20-100 MCG/ACT AERS respimat Inhale 1 puff into the lungs every 6 (six) hours as needed for wheezing. 03/16/16   Enedina Finner, MD    Inpatient Medications:  . apixaban  5 mg Oral BID  . atorvastatin  80 mg Oral QPM  . clopidogrel  75 mg Oral Daily  . fenofibrate  54 mg Oral Daily  . furosemide  20 mg Oral Q48H  . lisinopril  5 mg Oral Daily  . metoprolol succinate  25 mg Oral Daily  . nortriptyline  20 mg Oral QHS  . pantoprazole  40 mg Oral Daily  . pregabalin  100 mg Oral QPC lunch  . pregabalin  200 mg Oral QHS  . ranolazine  500 mg Oral BID  . senna-docusate  2 tablet Oral BID     Allergies: No Known Allergies  Social History   Social History  . Marital status: Single    Spouse name: N/A  . Number of children: N/A  . Years of education: N/A   Occupational History  . Not on file.   Social History Main Topics  . Smoking status: Former Smoker    Packs/day: 0.50    Years: 30.00    Types: Cigarettes    Quit date: 04/21/2015  . Smokeless tobacco: Never Used  . Alcohol use 8.4 oz/week    14 Standard drinks or equivalent per week  . Drug use: No     Comment: previously used cocaine - last used 2008  . Sexual activity: Not on file   Other Topics Concern  . Not on file   Social History Narrative  . No narrative on file     Family History  Problem Relation Age of Onset  . CAD Father   . Hypertension Father   . Heart failure Father   . Heart disease Maternal Grandmother      Review of Systems Positive for Chest pain Negative for: General:  chills, fever, night sweats or weight changes.  Cardiovascular: PND orthopnea syncope dizziness  Dermatological skin lesions rashes Respiratory: Cough congestion Urologic: Frequent urination urination at night and hematuria Abdominal: negative for nausea, vomiting, diarrhea, bright red blood per rectum, melena, or  hematemesis Neurologic: negative for visual changes, and/or hearing changes  All other systems reviewed and are otherwise negative except as noted above.  Labs:  Recent Labs  03/15/16 0837 03/15/16 1454 03/15/16 1823  TROPONINI <0.03 <0.03 <0.03   Lab Results  Component Value Date   WBC 6.0 03/15/2016   HGB 16.0 03/15/2016   HCT 45.7 03/15/2016   MCV 102.3 (H) 03/15/2016   PLT 152 03/15/2016    Recent Labs Lab 03/15/16 0837  NA 139  K 4.7  CL 105  CO2 26  BUN 15  CREATININE 1.33*  CALCIUM 9.3  PROT 8.0  BILITOT 0.6  ALKPHOS 58  ALT 24  AST 31  GLUCOSE 136*   Lab Results  Component Value Date   CHOL 335 (H) 10/08/2015   HDL NOT REPORTED DUE TO HIGH TRIGLYCERIDES 10/08/2015   LDLCALC UNABLE TO CALCULATE IF TRIGLYCERIDE OVER 400 mg/dL 40/98/119106/21/2017   TRIG 4,7821,940 (H) 10/08/2015   No results found for: DDIMER  Radiology/Studies:  Ct Angio Chest Pe W Or Wo Contrast  Result Date: 03/15/2016 CLINICAL DATA:  Chest pain and shortness of breath starting this morning. Coronary artery disease with multiple stents. EXAM: CT ANGIOGRAPHY CHEST WITH CONTRAST TECHNIQUE: Multidetector CT imaging of the chest was performed using the standard protocol during bolus administration of intravenous contrast. Multiplanar CT image reconstructions and MIPs were obtained to evaluate the vascular anatomy. CONTRAST:  75 cc of Isovue 370. COMPARISON:  Plain films earlier today.  CT 10/08/2015. FINDINGS: Cardiovascular: The quality of this exam for evaluation of pulmonary embolism is moderate to good primary limitation is of motion, as detailed below. No pulmonary embolism to the segmental level Prior median sternotomy. Aortic and branch vessel atherosclerosis. Aorta not opacified. Therefore dissection cannot be evaluated/excluded. Native coronary artery atherosclerosis. Mediastinum/Nodes: Subcarinal node measures 13 mm on image 47/series 4 and is enlarged since the prior exam. Calcified mediastinal and  bilateral hilar nodes are consistent with old granulomatous disease. No hilar adenopathy. Esophageal air-fluid level on image 52/series 4. Lungs/Pleura: Trace right. Pleural fluid. Minimal motion degradation. Lower lobe predominant bronchial wall thickening. Mild centrilobular emphysema probable scarring at the right apex, including on image 17/series 6. Mild pulmonary venous congestion as evidenced by septal thickening and patchy ground-glass opacity. This is lower lobe and slightly right-sided predominant. Upper Abdomen: Too small to characterize right hepatic lobe low-density lesion. Normal imaged portions of the spleen, stomach, pancreas, adrenal glands, kidneys. Musculoskeletal: Moderate thoracic spondylosis. Review of the MIP images confirms the above findings. IMPRESSION: 1. Minimally motion degraded exam. Given this factor, no evidence of pulmonary embolism. 2. Mild interstitial edema, as evidenced by a screw septal thickening patchy ground-glass opacity. 3. Trace right pleural fluid. 4. Borderline subcarinal adenopathy is likely reactive and could relate to fluid overload. Recommend attention on follow-up. 5. Probable scarring in the right apex. Consider follow-up chest CT at 3-6 months to confirm stability. 6. Esophageal air fluid level suggests dysmotility or gastroesophageal reflux. Electronically Signed   By: Jeronimo GreavesKyle  Talbot M.D.   On: 03/15/2016 17:46   Dg Chest Portable 1 View  Result Date: 03/15/2016 CLINICAL DATA:  Shortness of breath and chest tightness. EXAM: PORTABLE CHEST 1 VIEW COMPARISON:  Chest CTA and radiograph 10/08/2015 FINDINGS: Sequelae of prior CABG are again identified. The cardiomediastinal silhouette is unchanged and within normal limits. There is new pulmonary vascular congestion without overt alveolar edema. No segmental airspace consolidation, pleural effusion, or pneumothorax is identified. No acute osseous abnormality is seen. IMPRESSION: Mild pulmonary vascular congestion.  Electronically Signed   By: Sebastian AcheAllen  Grady M.D.   On: 03/15/2016 08:30    EKG: Normal sinus rhythm with inferior infarct age undetermined with nonspecific ST and T-wave changes  Weights: Filed Weights   03/15/16 0804  Weight: 106.6 kg (235 lb)     Physical Exam: Blood pressure 123/70, pulse 76, temperature 97.8 F (36.6 C), temperature source Oral, resp. rate 18, height 6' (1.829 m), weight 106.6 kg (235 lb), SpO2 96 %. Body mass index is 31.87 kg/m. General: Well developed, well nourished, in no acute distress. Head eyes ears nose throat: Normocephalic, atraumatic, sclera non-icteric, no xanthomas, nares are without  discharge. No apparent thyromegaly and/or mass  Lungs: Normal respiratory effort.  no wheezes, no rales, no rhonchi.  Heart: RRR with normal S1 S2. no murmur gallop, no rub, PMI is normal size and placement, carotid upstroke normal without bruit, jugular venous pressure is normal Abdomen: Soft, non-tender, non-distended with normoactive bowel sounds. No hepatomegaly. No rebound/guarding. No obvious abdominal masses. Abdominal aorta is normal size without bruit Extremities: No edema. no cyanosis, no clubbing, no ulcers  Peripheral : 2+ bilateral upper extremity pulses, 1 + bilateral femoral pulses, 1 + bilateral dorsal pedal pulse Neuro: Alert and oriented. No facial asymmetry. No focal deficit. Moves all extremities spontaneously. Musculoskeletal: Normal muscle tone without kyphosis Psych:  Responds to questions appropriately with a normal affect.    Assessment: [age] year old male with known coronary disease status post coronary bypass graft PCI and stent placement in the past of severe peripheral vascular disease chronic kidney disease stage II essential hypertension mixed hyperlipidemia having atypical chest pain without evidence of myocardial infarction  Plan: 1. Continue medication management for previous myocardial infarction and including Plavix and aspirin for further  risk reduction of the myocardial infarction 2. Patient is to continue peripheral vascular disease anticoagulation with elequis 3. Patient is to abstain from tobacco abuse 4. No further cardiac diagnostic testing and/or treatment today 5. ACE inhibitor beta blocker for previous myocardial infarction essential hypertension 6. High intensity cholesterol therapy with atorvastatin Evan. Ranexa for atypical chest pain 8. Okay for discharge home from cardiac standpoint with follow-up in one to 2 weeks  Signed, Lamar BlinksBruce J Misbah Hornaday M.D. [facility]ACC [facility] 03/16/2016, 8:57 AM

## 2020-07-18 DEATH — deceased
